# Patient Record
Sex: Female | Born: 1950 | Race: White | Hispanic: No | Marital: Married
Health system: Midwestern US, Academic
[De-identification: ages and names within clinical notes are randomized; demographics above are authoritative.]

---

## 2017-02-07 LAB — COMPREHENSIVE METABOLIC PANEL
Lab: 1.1 — ABNORMAL HIGH (ref 0.0–1.0)
Lab: 13
Lab: 17
Lab: 18
Lab: 3.8
Lab: 67
Lab: 7

## 2017-02-07 LAB — LIPID PROFILE: Lab: 4

## 2017-11-08 ENCOUNTER — Encounter: Admit: 2017-11-08 | Discharge: 2017-11-08 | Payer: MEDICARE

## 2017-11-11 ENCOUNTER — Encounter: Admit: 2017-11-11 | Discharge: 2017-11-11 | Payer: MEDICARE

## 2017-11-11 DIAGNOSIS — G4733 Obstructive sleep apnea (adult) (pediatric): ICD-10-CM

## 2017-11-11 DIAGNOSIS — E119 Type 2 diabetes mellitus without complications: ICD-10-CM

## 2017-11-11 DIAGNOSIS — E785 Hyperlipidemia, unspecified: Principal | ICD-10-CM

## 2017-11-11 DIAGNOSIS — E669 Obesity, unspecified: ICD-10-CM

## 2017-11-12 ENCOUNTER — Ambulatory Visit: Admit: 2017-11-12 | Discharge: 2017-11-13 | Payer: MEDICARE

## 2017-11-12 ENCOUNTER — Encounter: Admit: 2017-11-12 | Discharge: 2017-11-12 | Payer: MEDICARE

## 2017-11-12 DIAGNOSIS — E669 Obesity, unspecified: ICD-10-CM

## 2017-11-12 DIAGNOSIS — G4733 Obstructive sleep apnea (adult) (pediatric): ICD-10-CM

## 2017-11-12 DIAGNOSIS — E119 Type 2 diabetes mellitus without complications: ICD-10-CM

## 2017-11-12 DIAGNOSIS — E039 Hypothyroidism, unspecified: ICD-10-CM

## 2017-11-12 DIAGNOSIS — R0600 Dyspnea, unspecified: ICD-10-CM

## 2017-11-12 DIAGNOSIS — I10 Essential (primary) hypertension: Principal | ICD-10-CM

## 2017-11-12 DIAGNOSIS — E785 Hyperlipidemia, unspecified: Principal | ICD-10-CM

## 2017-11-12 MED ORDER — FUROSEMIDE 20 MG PO TAB
40 mg | ORAL_TABLET | Freq: Every morning | ORAL | 3 refills | 90.00000 days | Status: AC
Start: 2017-11-12 — End: 2018-11-03

## 2017-11-14 ENCOUNTER — Encounter: Admit: 2017-11-14 | Discharge: 2017-11-14 | Payer: MEDICARE

## 2017-11-27 ENCOUNTER — Encounter: Admit: 2017-11-27 | Discharge: 2017-11-27 | Payer: MEDICARE

## 2017-11-27 ENCOUNTER — Ambulatory Visit: Admit: 2017-11-27 | Discharge: 2017-11-28 | Payer: MEDICARE

## 2017-11-27 ENCOUNTER — Ambulatory Visit: Admit: 2017-11-27 | Discharge: 2017-11-27 | Payer: MEDICARE

## 2017-11-27 DIAGNOSIS — I10 Essential (primary) hypertension: Principal | ICD-10-CM

## 2017-11-27 DIAGNOSIS — E039 Hypothyroidism, unspecified: ICD-10-CM

## 2017-11-27 DIAGNOSIS — E785 Hyperlipidemia, unspecified: ICD-10-CM

## 2017-11-27 MED ORDER — PERFLUTREN LIPID MICROSPHERES 1.1 MG/ML IV SUSP
1-20 mL | Freq: Once | INTRAVENOUS | 0 refills | Status: CP | PRN
Start: 2017-11-27 — End: ?

## 2017-11-28 ENCOUNTER — Encounter: Admit: 2017-11-28 | Discharge: 2017-11-28 | Payer: MEDICARE

## 2017-11-28 DIAGNOSIS — E039 Hypothyroidism, unspecified: ICD-10-CM

## 2017-11-28 DIAGNOSIS — E669 Obesity, unspecified: ICD-10-CM

## 2017-11-28 DIAGNOSIS — G4733 Obstructive sleep apnea (adult) (pediatric): ICD-10-CM

## 2017-11-28 DIAGNOSIS — E119 Type 2 diabetes mellitus without complications: ICD-10-CM

## 2017-11-28 DIAGNOSIS — E785 Hyperlipidemia, unspecified: Principal | ICD-10-CM

## 2017-11-29 ENCOUNTER — Encounter: Admit: 2017-11-29 | Discharge: 2017-11-29 | Payer: MEDICARE

## 2017-11-29 DIAGNOSIS — I2510 Atherosclerotic heart disease of native coronary artery without angina pectoris: Principal | ICD-10-CM

## 2017-11-29 DIAGNOSIS — I10 Essential (primary) hypertension: ICD-10-CM

## 2017-11-29 DIAGNOSIS — R9439 Abnormal result of other cardiovascular function study: Principal | ICD-10-CM

## 2017-11-29 DIAGNOSIS — R0600 Dyspnea, unspecified: Principal | ICD-10-CM

## 2017-12-03 ENCOUNTER — Encounter: Admit: 2017-12-03 | Discharge: 2017-12-03 | Payer: MEDICARE

## 2017-12-03 LAB — BASIC METABOLIC PANEL
Lab: 0.8 U/L — ABNORMAL HIGH (ref 7–40)
Lab: 11 mL/min (ref >60)
Lab: 139 mg/dL (ref 8.5–10.6)
Lab: 151 MMOL/L — ABNORMAL HIGH (ref 80–115)
Lab: 21 U/L — ABNORMAL HIGH (ref 9.8–20.1)
Lab: 27 g/dL (ref 3.5–5.0)
Lab: 9.3 U/L (ref 7–56)

## 2017-12-03 LAB — BNP (B-TYPE NATRIURETIC PEPTI): Lab: <10 g/dL (ref 6.0–8.0)

## 2017-12-03 LAB — MAGNESIUM: Lab: 2.4 mg/dL (ref 0.3–1.2)

## 2017-12-03 MED ORDER — ACETAMINOPHEN 325 MG PO TAB
650 mg | ORAL | 0 refills | Status: CN | PRN
Start: 2017-12-03 — End: ?

## 2017-12-03 MED ORDER — ASPIRIN 81 MG PO CHEW
324 mg | Freq: Once | ORAL | 0 refills | Status: CN
Start: 2017-12-03 — End: ?

## 2017-12-03 MED ORDER — ALUMINUM-MAGNESIUM HYDROXIDE 200-200 MG/5 ML PO SUSP
30 mL | ORAL | 0 refills | Status: CN | PRN
Start: 2017-12-03 — End: ?

## 2017-12-03 MED ORDER — NITROGLYCERIN 0.4 MG SL SUBL
.4 mg | SUBLINGUAL | 0 refills | Status: CN | PRN
Start: 2017-12-03 — End: ?

## 2017-12-03 MED ORDER — DOCUSATE SODIUM 100 MG PO CAP
100 mg | Freq: Every day | ORAL | 0 refills | Status: CN | PRN
Start: 2017-12-03 — End: ?

## 2017-12-05 ENCOUNTER — Encounter: Admit: 2017-12-05 | Discharge: 2017-12-05 | Payer: MEDICARE

## 2017-12-05 DIAGNOSIS — I10 Essential (primary) hypertension: ICD-10-CM

## 2017-12-05 DIAGNOSIS — E039 Hypothyroidism, unspecified: ICD-10-CM

## 2017-12-05 DIAGNOSIS — R0600 Dyspnea, unspecified: Principal | ICD-10-CM

## 2017-12-05 DIAGNOSIS — E785 Hyperlipidemia, unspecified: ICD-10-CM

## 2017-12-05 LAB — CBC
Lab: 13
Lab: 275
Lab: 29
Lab: 32 — ABNORMAL LOW (ref 33.0–37.0)
Lab: 11 mL/min — ABNORMAL HIGH (ref 4.8–10.8)
Lab: 14
Lab: 4.9 mL/min — ABNORMAL LOW
Lab: 45
Lab: 90

## 2017-12-06 ENCOUNTER — Encounter: Admit: 2017-12-06 | Discharge: 2017-12-06 | Payer: MEDICARE

## 2017-12-11 ENCOUNTER — Encounter: Admit: 2017-12-11 | Discharge: 2017-12-11 | Payer: MEDICARE

## 2017-12-11 ENCOUNTER — Ambulatory Visit: Admit: 2017-12-11 | Discharge: 2017-12-11 | Payer: MEDICARE

## 2017-12-11 DIAGNOSIS — E119 Type 2 diabetes mellitus without complications: ICD-10-CM

## 2017-12-11 DIAGNOSIS — E785 Hyperlipidemia, unspecified: Principal | ICD-10-CM

## 2017-12-11 DIAGNOSIS — R9439 Abnormal result of other cardiovascular function study: ICD-10-CM

## 2017-12-11 DIAGNOSIS — E039 Hypothyroidism, unspecified: ICD-10-CM

## 2017-12-11 DIAGNOSIS — R0609 Other forms of dyspnea: ICD-10-CM

## 2017-12-11 DIAGNOSIS — E669 Obesity, unspecified: ICD-10-CM

## 2017-12-11 DIAGNOSIS — G4733 Obstructive sleep apnea (adult) (pediatric): ICD-10-CM

## 2017-12-11 LAB — BASIC METABOLIC PANEL
Lab: 0.7 mg/dL (ref 0.4–1.00)
Lab: 10 g/dL — ABNORMAL LOW (ref 3–12)
Lab: 103 MMOL/L (ref 98–110)
Lab: 139 MMOL/L — ABNORMAL HIGH (ref 137–147)
Lab: 162 mg/dL — ABNORMAL HIGH (ref 70–100)
Lab: 18 mg/dL — ABNORMAL LOW (ref 7–25)
Lab: 26 MMOL/L (ref 21–30)
Lab: 4.2 MMOL/L — ABNORMAL HIGH (ref 3.5–5.1)
Lab: 9.2 mg/dL — ABNORMAL HIGH (ref 8.5–10.6)
Lab: >60 mL/min — ABNORMAL HIGH (ref >60)
Lab: >60 mL/min — ABNORMAL LOW (ref >60)

## 2017-12-11 LAB — POC GLUCOSE: Lab: 156 mg/dL — ABNORMAL HIGH (ref 70–100)

## 2017-12-11 LAB — LIPID PROFILE: Lab: 127 mg/dL (ref <200)

## 2017-12-11 MED ORDER — ASPIRIN 81 MG PO CHEW
324 mg | Freq: Once | ORAL | 0 refills | Status: DC
Start: 2017-12-11 — End: 2017-12-12

## 2017-12-11 MED ORDER — DOCUSATE SODIUM 100 MG PO CAP
100 mg | Freq: Every day | ORAL | 0 refills | Status: DC | PRN
Start: 2017-12-11 — End: 2017-12-12

## 2017-12-11 MED ORDER — SODIUM CHLORIDE 0.9 % IV SOLP
1000 mL | INTRAVENOUS | 0 refills | Status: DC
Start: 2017-12-11 — End: 2017-12-12
  Administered 2017-12-11: 16:00:00 1000 mL via INTRAVENOUS

## 2017-12-11 MED ORDER — ALUMINUM-MAGNESIUM HYDROXIDE 200-200 MG/5 ML PO SUSP
30 mL | ORAL | 0 refills | Status: DC | PRN
Start: 2017-12-11 — End: 2017-12-12

## 2017-12-11 MED ORDER — ACETAMINOPHEN 325 MG PO TAB
650 mg | ORAL | 0 refills | Status: DC | PRN
Start: 2017-12-11 — End: 2017-12-12

## 2017-12-11 MED ORDER — SODIUM CHLORIDE 0.9 % IV SOLP
250 mL | INTRAVENOUS | 0 refills | Status: CP
Start: 2017-12-11 — End: ?
  Administered 2017-12-11: 08:00:00 250 mL via INTRAVENOUS

## 2017-12-11 MED ORDER — NITROGLYCERIN 0.4 MG SL SUBL
.4 mg | SUBLINGUAL | 0 refills | Status: DC | PRN
Start: 2017-12-11 — End: 2017-12-12

## 2017-12-11 MED ORDER — METFORMIN 500 MG PO TAB
500 mg | ORAL_TABLET | Freq: Two times a day (BID) | ORAL | 3 refills | Status: AC
Start: 2017-12-11 — End: 2019-05-04

## 2017-12-13 ENCOUNTER — Encounter: Admit: 2017-12-13 | Discharge: 2017-12-13 | Payer: MEDICARE

## 2017-12-16 LAB — POC ACTIVATED CLOTTING TIME: Lab: 277 s

## 2018-01-07 ENCOUNTER — Encounter: Admit: 2018-01-07 | Discharge: 2018-01-07 | Payer: MEDICARE

## 2018-02-27 ENCOUNTER — Encounter: Admit: 2018-02-27 | Discharge: 2018-02-27 | Payer: MEDICARE

## 2018-02-27 ENCOUNTER — Ambulatory Visit: Admit: 2018-02-27 | Discharge: 2018-02-27 | Payer: MEDICARE

## 2018-02-27 DIAGNOSIS — I10 Essential (primary) hypertension: Principal | ICD-10-CM

## 2018-02-27 DIAGNOSIS — E785 Hyperlipidemia, unspecified: Principal | ICD-10-CM

## 2018-02-27 DIAGNOSIS — I2510 Atherosclerotic heart disease of native coronary artery without angina pectoris: ICD-10-CM

## 2018-02-27 DIAGNOSIS — R9439 Abnormal result of other cardiovascular function study: ICD-10-CM

## 2018-02-27 DIAGNOSIS — E039 Hypothyroidism, unspecified: ICD-10-CM

## 2018-02-27 DIAGNOSIS — E669 Obesity, unspecified: ICD-10-CM

## 2018-02-27 DIAGNOSIS — I6521 Occlusion and stenosis of right carotid artery: ICD-10-CM

## 2018-02-27 DIAGNOSIS — R0989 Other specified symptoms and signs involving the circulatory and respiratory systems: ICD-10-CM

## 2018-02-27 DIAGNOSIS — G4733 Obstructive sleep apnea (adult) (pediatric): ICD-10-CM

## 2018-02-27 DIAGNOSIS — E119 Type 2 diabetes mellitus without complications: ICD-10-CM

## 2018-02-27 MED ORDER — CARVEDILOL 6.25 MG PO TAB
6.25 mg | ORAL_TABLET | Freq: Two times a day (BID) | ORAL | 3 refills | 90.00000 days | Status: AC
Start: 2018-02-27 — End: 2019-03-13

## 2018-02-27 MED ORDER — ATORVASTATIN 80 MG PO TAB
80 mg | ORAL_TABLET | Freq: Every day | ORAL | 3 refills | Status: AC
Start: 2018-02-27 — End: 2019-05-08

## 2018-02-27 MED ORDER — NITROGLYCERIN 0.4 MG SL SUBL
ORAL_TABLET | SUBLINGUAL | 3 refills | 9.00000 days | Status: AC | PRN
Start: 2018-02-27 — End: ?

## 2018-03-04 ENCOUNTER — Encounter: Admit: 2018-03-04 | Discharge: 2018-03-04 | Payer: MEDICARE

## 2018-03-04 DIAGNOSIS — I6521 Occlusion and stenosis of right carotid artery: Principal | ICD-10-CM

## 2018-06-09 ENCOUNTER — Encounter: Admit: 2018-06-09 | Discharge: 2018-06-09 | Payer: MEDICARE

## 2018-06-09 NOTE — Telephone Encounter
Erroneous encounter-disregard

## 2018-09-11 ENCOUNTER — Encounter: Admit: 2018-09-11 | Discharge: 2018-09-11

## 2018-09-11 ENCOUNTER — Ambulatory Visit: Admit: 2018-09-11 | Discharge: 2018-09-12

## 2018-09-11 DIAGNOSIS — R0989 Other specified symptoms and signs involving the circulatory and respiratory systems: Secondary | ICD-10-CM

## 2018-09-11 DIAGNOSIS — E785 Hyperlipidemia, unspecified: Secondary | ICD-10-CM

## 2018-09-11 DIAGNOSIS — E119 Type 2 diabetes mellitus without complications: Secondary | ICD-10-CM

## 2018-09-11 DIAGNOSIS — I2510 Atherosclerotic heart disease of native coronary artery without angina pectoris: Secondary | ICD-10-CM

## 2018-09-11 DIAGNOSIS — G4733 Obstructive sleep apnea (adult) (pediatric): Secondary | ICD-10-CM

## 2018-09-11 DIAGNOSIS — R0600 Dyspnea, unspecified: Secondary | ICD-10-CM

## 2018-09-11 DIAGNOSIS — I10 Essential (primary) hypertension: Secondary | ICD-10-CM

## 2018-09-11 DIAGNOSIS — R9439 Abnormal result of other cardiovascular function study: Secondary | ICD-10-CM

## 2018-09-11 DIAGNOSIS — E669 Obesity, unspecified: Secondary | ICD-10-CM

## 2018-09-11 DIAGNOSIS — R739 Hyperglycemia, unspecified: Secondary | ICD-10-CM

## 2018-09-11 DIAGNOSIS — E039 Hypothyroidism, unspecified: Secondary | ICD-10-CM

## 2018-09-11 DIAGNOSIS — I6521 Occlusion and stenosis of right carotid artery: Secondary | ICD-10-CM

## 2018-09-11 NOTE — Progress Notes
Date of Service: 09/11/2018    Meghan Carlson is a 68 y.o. female.       HPI     Meghan Carlson is a 68 y.o. female who returns to see me in the advanced heart failure clinic in [place].  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 Her echocardiogram performed 11/27/2017 showed an EF of 45 to 50% with mild LVH, mild diastolic dysfunction, no significant valvular disease, and mild right-sided dysfunction.  Her central venous pressure was normal. Coronary angiography 12/11/2017 revealed no obstructive lesions in the major vessels, though she did have a 90% lesion in the very small diagonal.  She had normal hemodynamics.  It was noted that radial approach was very difficult and uncomfortable, so any future attempt at cardiac catheterization should probably be with femoral access.  Carotid doppler ordered 02/2018 showed no hemodynamically significant stenosis and minimal plaque.    Today she reports stable dyspnea on exertion, sometimes worse in allergy season, but improved on inhaler.  She currently denies exertional chest pain, irregular heartbeat/palpitations, dizzy spells, syncope/near syncope, edema, orthopnea, and paroxysmal nocturnal dyspnea.  Home blood pressure is not checked.  Weight on home scale has been stable within 5 pounds, and varies with diet.  She does not feel she is retaining fluid.         Vitals:    09/11/18 1406 09/11/18 1412   BP: 124/78 122/74   BP Source: Arm, Left Upper Arm, Right Upper   Pulse: 63    SpO2: 94%    Weight: 87.1 kg (192 lb)    Height: 1.6 m (5' 3)    PainSc: Zero      Body mass index is 34.01 kg/m???.     Past Medical History  Patient Active Problem List    Diagnosis Date Noted   ??? Coronary artery disease involving native coronary artery of native heart without angina pectoris 08/25/2018   ??? Carotid artery calcification 03/18/2018 03/05/18 Carotid duplex:  Minimal calcified plaque in the right carotid bulb.  No hemodynamically significant common or internal carotid artery stenosis.       ??? Abnormal stress test 12/11/2017   ??? DOE (dyspnea on exertion) 11/28/2017   ??? Hypothyroidism 11/11/2017   ??? Dyslipidemia 11/11/2017   ??? Essential hypertension 11/11/2017   ??? Obesity 11/11/2017   ??? Diabetes mellitus (HCC) 11/11/2017   ??? OSA (obstructive sleep apnea) 11/11/2017         Review of Systems   Constitution: Negative.   HENT: Negative.    Eyes: Negative.    Cardiovascular: Negative.    Respiratory: Negative.    Endocrine: Negative.    Hematologic/Lymphatic: Negative.    Skin: Negative.    Musculoskeletal: Negative.    Gastrointestinal: Negative.    Genitourinary: Negative.    Neurological: Negative.    Psychiatric/Behavioral: Negative.    Allergic/Immunologic: Negative.        Physical Exam  Constitutional: She appears well-developed and well-nourished.   HENT:  Head: Normocephalic.   Mouth/Throat: Oropharynx is clear and moist.   Eyes: Conjunctivae are normal.   Neck: Normal range of motion. No JVD present. Normal carotid pulses.  Cardiovascular: Normal rate, regular rhythm, normal heart sounds and intact distal pulses. Exam reveals no gallop and no friction rub.  No murmur heard.  Pulmonary/Chest: Effort normal and breath sounds normal. No respiratory distress. She has no wheezes. She has  no rales. She exhibits no chest wall tenderness.   Abdominal: Soft. Bowel sounds are normal. She exhibits no distension. There is no tenderness.   Musculoskeletal: Normal range of motion and muscular tone. She exhibits no edema with no tenderness.   Neurological: She is alert and oriented to person, place, and time. No focal deficits.  Skin: Skin is warm. No erythema.   Psychiatric: She has a normal mood and affect. Judgment, behavior, and thought content normal.     CBC w/Diff    Lab Results   Component Value Date/Time    WBC 11.5 (H) 12/05/2017 RBC 4.98 12/05/2017    HGB 14.9 12/05/2017    HCT 45.3 12/05/2017    MCV 90.9 12/05/2017    MCH 29.9 12/05/2017    MCHC 32.9 (L) 12/05/2017    RDW 13.3 12/05/2017    PLTCT 275 12/05/2017    No results found for: NEUT, ANC, LYMA, ALC, MONA, AMC, EOSA, AEC, BASA, ABC     Comprehensive Metabolic Profile    Lab Results   Component Value Date/Time    NA 139 12/11/2017 10:00 AM    K 4.2 12/11/2017 10:00 AM    K 4.4 12/03/2017    K 3.8 02/07/2017    CL 103 12/11/2017 10:00 AM    CO2 26 12/11/2017 10:00 AM    GAP 10 12/11/2017 10:00 AM    BUN 18 12/11/2017 10:00 AM    CR 0.73 12/11/2017 10:00 AM    CR 0.81 12/03/2017    CR 0.70 02/07/2017    GLU 162 (H) 12/11/2017 10:00 AM    Lab Results   Component Value Date/Time    CA 9.2 12/11/2017 10:00 AM    ALBUMIN 3.8 02/07/2017    TOTPROT 7.0 02/07/2017    ALKPHOS 67 02/07/2017    AST 18 02/07/2017    ALT 17 02/07/2017    TOTBILI 1.10 (H) 02/07/2017    GFR >60 12/11/2017 10:00 AM    GFRAA >60 12/11/2017 10:00 AM        Cardiac Enzymes Lipids   Lab Results   Component Value Date/Time    BNP <10 12/03/2017    Lab Results   Component Value Date    CHOL 127 12/11/2017    TRIG 169 (H) 12/11/2017    HDL 32 (L) 12/11/2017    LDL 74 12/11/2017    VLDL 34 12/11/2017    NONHDLCHOL 95 12/11/2017    CHOLHDLC 4 02/07/2017         Coagulation Endocrine   No results found for: INR, PT Lab Results   Component Value Date/Time    HGBA1C 6.7 (H) 02/07/2017    TSH 3.25 02/07/2017             Cardiovascular Studies      Problems Addressed Today  Encounter Diagnoses   Name Primary?   ??? Dyslipidemia Yes   ??? Essential hypertension    ??? Bruit    ??? Calcification of right carotid artery    ??? Elevated blood sugar    ??? DOE (dyspnea on exertion)    ??? Coronary artery disease involving native coronary artery of native heart without angina pectoris        Assessment and Plan     Today she is warm, normotensive, euvolemic, and was stable NYHA functional class II symptoms.  I think her worsening shortness of breath might be related to reactive airway disease, and we will send her for pulmonary function testing to see if  she needs more aggressive management of inhalers.  This can be followed with pulmonary referral if appropriate after the results are known.  She is appropriately on statin for dyslipidemia.  Her blood pressure is well controlled.    Patient instructions provided in writing:  1. Med changes today:  None  2. Please get pulmonary function testing for dyspnea and wheezing.  3. Please log your weight, blood pressure, and heart rate and bring to all visits.  4. Labs today: BNP, BMP, Mg, A1C and lipid panel  5. Return in 6 months.    Thank you for allowing me to participate in the care of this patient.  Please do not hesitate to contact me should you have any questions or concerns.    Baldo Ash, MD, PhD  Advanced Heart Failure and Transplant Cardiology  Pager [phone]       I spent 25 minutes with the patient today including approximately 15 minutes in counseling on her heart disease.  We reviewed the above treatment plan, went over risks/benefits/alternatives to the therapies, and all questions were answered to her satisfaction.      Current Medications (including today's revisions)  ??? albuterol (VENTOLIN HFA) 90 mcg/actuation inhaler Inhale 2 puffs by mouth into the lungs every 6 hours as needed for Wheezing or Shortness of Breath. Shake well before use.   ??? albuterol 0.083% (PROVENTIL; VENTOLIN) 2.5 mg /3 mL (0.083 %) nebulizer solution Inhale 3 mL solution by nebulizer as directed every 4 hours as needed for Wheezing or Shortness of Breath.   ??? atorvastatin (LIPITOR) 80 mg tablet Take one tablet by mouth daily.   ??? carvedilol (COREG) 6.25 mg tablet Take one tablet by mouth twice daily with meals. Take with food.   ??? diclofenac sodium (VOLTAREN-XR) 100 mg xr tablet Take 100 mg by mouth daily. Take with food. ??? escitalopram oxalate (LEXAPRO) 20 mg tablet Take 20 mg by mouth daily.   ??? fluticasone propionate (FLONASE) 50 mcg/actuation nasal spray Apply  to each nostril as directed as Needed. Shake bottle gently before using.    ??? furosemide (LASIX) 20 mg tablet Take two tablets by mouth every morning. As needed for swelling or weight gain   ??? levothyroxine (SYNTHROID) 50 mcg tablet Take 50 mcg by mouth daily 30 minutes before breakfast.   ??? losartan (COZAAR) 25 mg tablet Take 25 mg by mouth daily.   ??? metFORMIN (GLUCOPHAGE) 500 mg tablet Take one tablet by mouth twice daily with meals. Ok to resume on December 14, 2017   ??? nitroglycerin (NITROSTAT) 0.4 mg tablet 1 tab under tongue as needed for chest pain, may repeat in 5 min x 2   ??? oxycodone(+) (ROXICODONE, OXY-IR) 20 mg tablet Take 20 mg by mouth every 6 hours as needed   ??? triamcinolone acetonide (KENALOG) 0.1 % topical cream Apply  topically to affected area twice daily.   ??? valACYclovir (VALTREX) 500 mg tablet Take 500 mg by mouth every 24 hours.

## 2018-09-12 ENCOUNTER — Encounter: Admit: 2018-09-12 | Discharge: 2018-09-12

## 2018-09-12 DIAGNOSIS — I6521 Occlusion and stenosis of right carotid artery: Secondary | ICD-10-CM

## 2018-09-12 DIAGNOSIS — E785 Hyperlipidemia, unspecified: Secondary | ICD-10-CM

## 2018-09-12 DIAGNOSIS — I10 Essential (primary) hypertension: Secondary | ICD-10-CM

## 2018-09-12 DIAGNOSIS — R739 Hyperglycemia, unspecified: Secondary | ICD-10-CM

## 2018-09-12 DIAGNOSIS — R0989 Other specified symptoms and signs involving the circulatory and respiratory systems: Secondary | ICD-10-CM

## 2018-09-12 LAB — LIPID PROFILE
Lab: 113
Lab: 170 — ABNORMAL HIGH (ref <150)
Lab: 25 — ABNORMAL LOW (ref >40)
Lab: 34
Lab: 5
Lab: 54

## 2018-09-12 LAB — BASIC METABOLIC PANEL: Lab: 140

## 2018-09-12 LAB — HEMOGLOBIN A1C: Lab: 7.2 — ABNORMAL HIGH (ref <5.7)

## 2018-09-12 LAB — BNP (B-TYPE NATRIURETIC PEPTI): Lab: 23

## 2018-09-12 LAB — MAGNESIUM: Lab: 1.9 K/UL — AB (ref 0–0.20)

## 2018-09-15 ENCOUNTER — Encounter: Admit: 2018-09-15 | Discharge: 2018-09-15

## 2018-09-15 MED ORDER — FARXIGA 5 MG PO TAB
5 mg | ORAL_TABLET | Freq: Every day | ORAL | 3 refills | Status: DC
Start: 2018-09-15 — End: 2019-03-19

## 2018-09-15 NOTE — Telephone Encounter
[place], [employer], MD  Angelyn Punt, RN            Based on the lab results I would like to be a little bit more aggressive with the diabetes management. Let us start dapagliflozin at 5 mg daily in the morning, with plan to increase to 10 mg if tolerated and for better glucose control. She is welcome to review this with me or with her primary care physician or diabetes doctors if she has any questions. Thank you!      Called and spoke with patient on the phone. Discussed new medication. Pt is open to trying medication, does not feel like she needs to review with her PCP/diabetes doctor at this time. we discussed her previous labs that she had drawn.   She will pick [facility] up at local [facility] and call our office with any questions.

## 2018-10-06 ENCOUNTER — Encounter: Admit: 2018-10-06 | Discharge: 2018-10-06

## 2018-10-06 DIAGNOSIS — E119 Type 2 diabetes mellitus without complications: Secondary | ICD-10-CM

## 2018-10-06 DIAGNOSIS — E039 Hypothyroidism, unspecified: Secondary | ICD-10-CM

## 2018-10-06 DIAGNOSIS — E785 Hyperlipidemia, unspecified: Secondary | ICD-10-CM

## 2018-10-06 DIAGNOSIS — G4733 Obstructive sleep apnea (adult) (pediatric): Secondary | ICD-10-CM

## 2018-10-06 DIAGNOSIS — E669 Obesity, unspecified: Secondary | ICD-10-CM

## 2018-10-06 DIAGNOSIS — R9439 Abnormal result of other cardiovascular function study: Secondary | ICD-10-CM

## 2018-11-01 ENCOUNTER — Encounter: Admit: 2018-11-01 | Discharge: 2018-11-01

## 2018-11-03 MED ORDER — FUROSEMIDE 20 MG PO TAB
ORAL_TABLET | ORAL | 3 refills | 90.00000 days | Status: AC | PRN
Start: 2018-11-03 — End: ?

## 2018-12-11 ENCOUNTER — Encounter: Admit: 2018-12-11 | Discharge: 2018-12-11

## 2018-12-11 DIAGNOSIS — R9439 Abnormal result of other cardiovascular function study: Secondary | ICD-10-CM

## 2018-12-11 MED ORDER — METFORMIN 500 MG PO TAB
ORAL_TABLET | Freq: Two times a day (BID) | 3 refills
Start: 2018-12-11 — End: ?

## 2018-12-11 NOTE — Telephone Encounter
Refill request received for Metformin. Call placed to pt to inform her this refill will need to come from her PCP, Dr. Nyoka Cowden. And we will contact her pharmacy to request they forward refill request to her. LM on unauthorized VM and sent [place] msg. Requested reply if any questions.    [facility] and spoke to [place] requesting they forward refill request to Wenda Low, M.D. She will forward refill request to PCP.

## 2018-12-25 ENCOUNTER — Encounter: Admit: 2018-12-25 | Discharge: 2018-12-25 | Payer: MEDICARE

## 2019-03-13 ENCOUNTER — Encounter: Admit: 2019-03-13 | Discharge: 2019-03-13 | Payer: MEDICARE

## 2019-03-13 MED ORDER — CARVEDILOL 6.25 MG PO TAB
6.25 mg | ORAL_TABLET | Freq: Two times a day (BID) | ORAL | 3 refills | 90.00000 days | Status: AC
Start: 2019-03-13 — End: ?

## 2019-03-19 ENCOUNTER — Encounter: Admit: 2019-03-19 | Discharge: 2019-03-19 | Payer: MEDICARE

## 2019-03-19 DIAGNOSIS — E119 Type 2 diabetes mellitus without complications: Secondary | ICD-10-CM

## 2019-03-19 DIAGNOSIS — R9439 Abnormal result of other cardiovascular function study: Secondary | ICD-10-CM

## 2019-03-19 DIAGNOSIS — G4733 Obstructive sleep apnea (adult) (pediatric): Secondary | ICD-10-CM

## 2019-03-19 DIAGNOSIS — I10 Essential (primary) hypertension: Secondary | ICD-10-CM

## 2019-03-19 DIAGNOSIS — I2510 Atherosclerotic heart disease of native coronary artery without angina pectoris: Secondary | ICD-10-CM

## 2019-03-19 DIAGNOSIS — E785 Hyperlipidemia, unspecified: Secondary | ICD-10-CM

## 2019-03-19 DIAGNOSIS — R0600 Dyspnea, unspecified: Secondary | ICD-10-CM

## 2019-03-19 DIAGNOSIS — E669 Obesity, unspecified: Secondary | ICD-10-CM

## 2019-03-19 DIAGNOSIS — E039 Hypothyroidism, unspecified: Secondary | ICD-10-CM

## 2019-03-19 MED ORDER — SPIRONOLACTONE 25 MG PO TAB
25 mg | ORAL_TABLET | Freq: Every day | ORAL | 3 refills | 90.00000 days | Status: DC
Start: 2019-03-19 — End: 2019-06-04

## 2019-03-19 MED ORDER — LOSARTAN 50 MG PO TAB
50 mg | ORAL_TABLET | Freq: Every day | ORAL | 3 refills | 30.00000 days | Status: AC
Start: 2019-03-19 — End: ?

## 2019-03-19 NOTE — Progress Notes
Date of Service: 03/19/2019    Meghan Carlson is a 68 y.o. female.       HPI     Meghan Carlson?is a 68 y.o.?female?who returns to see me in the advanced heart failure clinic in [place]. ?She has biventricular nonischemic cardiomyopathy and stable?coronary disease?discovered in 2019 at time of and left heart cath done for abnormal stress test. Her echocardiogram performed 11/27/2017 showed an EF of 45 to 50% with mild LVH, mild diastolic dysfunction, no significant valvular disease, and mild right-sided dysfunction.??Her central venous pressure was normal. Coronary angiography 12/11/2017 revealed no obstructive lesions in the major vessels, though she did have a 90% lesion in the very small diagonal. ?She had normal hemodynamics. ?It was noted that radial approach was very difficult and uncomfortable, so any future attempt at cardiac catheterization should probably be with femoral access.  Carotid doppler ordered 02/2018 showed no hemodynamically significant stenosis and minimal plaque.  She has had right rotator cuff surgery just before halloween, and has been recovering.    Today she reports stable dyspnea on exertion if she walks fast or goes up a flight of stairs, and she has also had worsening fatigue.  She currently denies exertional chest pain, irregular heartbeat/palpitations, dizzy spells, syncope/near syncope, edema, and paroxysmal nocturnal dyspnea.  Her home blood pressure is typically 140s/60-70s, and pulse remains around 60-80.  Her weight monitored on home scale has been down a few pounds.    She has had orthopnea and sleeps 20 degrees elevated, though allergies and congestion are worse flat as well.  She notes today worsening of her eczema in the winter and occasional loose stools on Metformin. Her EKG today sinus rhythm at 58 bpm with a QRS of 100 ms, QTC 421 ms.  There is no evidence of ischemia, although she has flat T waves diffusely.  There is no evidence of prior infarct or chamber enlargement.       Vitals:    03/19/19 1055 03/19/19 1111   BP: (!) 146/82 (!) 148/82   BP Source: Arm, Left Upper Arm, Left Upper   Pulse: 64    Temp: 36.5 ?C (97.7 ?F)    SpO2: 98%    Weight: 83.3 kg (183 lb 9.6 oz)    Height: 1.6 m (5' 3)    PainSc: Zero      Body mass index is 32.52 kg/m?Marland Kitchen     Past Medical History  Patient Active Problem List    Diagnosis Date Noted   ? Coronary artery disease involving native coronary artery of native heart without angina pectoris 08/25/2018   ? Carotid artery calcification 03/18/2018     03/05/18 Carotid duplex:  Minimal calcified plaque in the right carotid bulb.  No hemodynamically significant common or internal carotid artery stenosis.       ? Abnormal stress test 12/11/2017   ? DOE (dyspnea on exertion) 11/28/2017   ? Hypothyroidism 11/11/2017   ? Dyslipidemia 11/11/2017   ? Essential hypertension 11/11/2017   ? Obesity 11/11/2017   ? Diabetes mellitus (HCC) 11/11/2017   ? OSA (obstructive sleep apnea) 11/11/2017         Review of Systems   Constitution: Negative.   HENT: Negative.    Eyes: Negative.    Cardiovascular: Negative.    Respiratory: Negative.    Endocrine: Negative.    Hematologic/Lymphatic: Negative.    Skin: Negative.    Musculoskeletal: Negative.    Gastrointestinal: Negative.    Genitourinary: Negative.    Neurological:  Negative.    Psychiatric/Behavioral: Negative.    Allergic/Immunologic: Negative.        Physical Exam  Constitutional: She appears well-developed and well-nourished.   HENT:  Head: Normocephalic.   Mouth/Throat: Oropharynx is clear and moist.   Eyes: Conjunctivae are normal.   Neck: Normal range of motion. No JVD present. Normal carotid pulses. Cardiovascular: Normal rate, regular rhythm, normal heart sounds and intact distal pulses. Exam reveals no gallop and no friction rub.  No murmur heard.  Pulmonary/Chest: Effort normal and breath sounds normal. No respiratory distress. She has no wheezes. She has no rales. She exhibits no chest wall tenderness.   Abdominal: Soft. Bowel sounds are normal. She exhibits no distension. There is no tenderness.   Musculoskeletal: Normal range of motion and muscular tone. She exhibits no edema with no tenderness.   Neurological: She is alert and oriented to person, place, and time. No focal deficits.  Skin: Skin is warm. No erythema.   Psychiatric: She has a normal mood and affect. Judgment, behavior, and thought content normal.       Cardiovascular Studies      Problems Addressed Today  Encounter Diagnoses   Name Primary?   ? Essential hypertension Yes   ? Coronary artery disease involving native coronary artery of native heart without angina pectoris    ? DOE (dyspnea on exertion)    ? Abnormal stress test    ? OSA (obstructive sleep apnea)        Assessment and Plan     Today she appears hypertensive, euvolemic, and with stable NYHA functional class III symptoms of systolic heart failure with underlying coronary artery disease, but no angina.  Plan:  1. Med changes today:  Increase losartan to 50 mg daily.  2. Start Aldactone 25 mg daily as well.  3. Try increasing Metformin to 1000 mg twice daily, if GI symptoms or loose stools worsen revert back to 500 mg dose.  4. We will stop [place] after this current supply runs out due to financial constraints.  5. We will provide sheets for her to record weight, blood pressure, and heart rate daily and bring this log to all clinic visits.  6. 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  7. We will also schedule PFT for worsening dyspnea as these were not obtained after last visit.  8. After the above medication changes we will repeat labs in 2 weeks: BNP, BMP, Mg 9. She will return to see Dr. Pierre Bali in 3 months.         I spent 30 minutes with the patient today including greater than 15 minutes in counseling on her heart disease.  We reviewed the above treatment plan, went over risks/benefits/alternatives to the therapies, and all questions were answered to her satisfaction.      Current Medications (including today's revisions)  ? albuterol (VENTOLIN HFA) 90 mcg/actuation inhaler Inhale 2 puffs by mouth into the lungs every 6 hours as needed for Wheezing or Shortness of Breath. Shake well before use.   ? albuterol 0.083% (PROVENTIL; VENTOLIN) 2.5 mg /3 mL (0.083 %) nebulizer solution Inhale 3 mL solution by nebulizer as directed every 4 hours as needed for Wheezing or Shortness of Breath.   ? atorvastatin (LIPITOR) 80 mg tablet Take one tablet by mouth daily.   ? carvediloL (COREG) 6.25 mg tablet TAKE ONE TABLET BY MOUTH TWICE DAILY WITH MEALS. TAKE WITH FOOD.   ? dapagliflozin (FARXIGA) 5 mg tablet Take one tablet  by mouth daily.   ? diclofenac sodium (VOLTAREN-XR) 100 mg xr tablet Take 100 mg by mouth daily. Take with food.   ? escitalopram oxalate (LEXAPRO) 20 mg tablet Take 20 mg by mouth daily.   ? fluticasone propionate (FLONASE) 50 mcg/actuation nasal spray Apply  to each nostril as directed as Needed. Shake bottle gently before using.    ? furosemide (LASIX) 20 mg tablet TAKE 2 TABLETS BY MOUTH IN THE MORNING AS NEEDED FOR SWELLING OR WEIGHT GAIN   ? levothyroxine (SYNTHROID) 50 mcg tablet Take 50 mcg by mouth daily 30 minutes before breakfast.   ? losartan (COZAAR) 25 mg tablet Take 25 mg by mouth daily.   ? metFORMIN (GLUCOPHAGE) 500 mg tablet Take one tablet by mouth twice daily with meals. Ok to resume on December 14, 2017   ? nitroglycerin (NITROSTAT) 0.4 mg tablet 1 tab under tongue as needed for chest pain, may repeat in 5 min x 2   ? oxycodone(+) (ROXICODONE, OXY-IR) 20 mg tablet Take 20 mg by mouth every 6 hours as needed ? triamcinolone acetonide (KENALOG) 0.1 % topical cream Apply  topically to affected area twice daily.   ? valACYclovir (VALTREX) 500 mg tablet Take 500 mg by mouth every 24 hours.

## 2019-03-25 ENCOUNTER — Encounter: Admit: 2019-03-25 | Discharge: 2019-03-25 | Payer: MEDICARE

## 2019-03-25 ENCOUNTER — Ambulatory Visit: Admit: 2019-03-25 | Discharge: 2019-03-25 | Payer: MEDICARE

## 2019-03-25 DIAGNOSIS — R9439 Abnormal result of other cardiovascular function study: Secondary | ICD-10-CM

## 2019-03-25 DIAGNOSIS — R0600 Dyspnea, unspecified: Secondary | ICD-10-CM

## 2019-03-25 DIAGNOSIS — I10 Essential (primary) hypertension: Secondary | ICD-10-CM

## 2019-03-25 DIAGNOSIS — G4733 Obstructive sleep apnea (adult) (pediatric): Secondary | ICD-10-CM

## 2019-03-25 DIAGNOSIS — I2510 Atherosclerotic heart disease of native coronary artery without angina pectoris: Secondary | ICD-10-CM

## 2019-03-25 LAB — BASIC METABOLIC PANEL
Lab: 0.6
Lab: 105
Lab: 14
Lab: 14
Lab: 142
Lab: 151 — ABNORMAL HIGH (ref 70–105)
Lab: 26
Lab: 3.4 — ABNORMAL LOW (ref 3.5–5.1)
Lab: 8.9
Lab: 93

## 2019-03-25 LAB — BNP (B-TYPE NATRIURETIC PEPTI)

## 2019-04-01 ENCOUNTER — Encounter: Admit: 2019-04-01 | Discharge: 2019-04-01 | Payer: MEDICARE

## 2019-04-01 DIAGNOSIS — I2510 Atherosclerotic heart disease of native coronary artery without angina pectoris: Secondary | ICD-10-CM

## 2019-04-01 DIAGNOSIS — R0600 Dyspnea, unspecified: Secondary | ICD-10-CM

## 2019-04-01 DIAGNOSIS — E876 Hypokalemia: Secondary | ICD-10-CM

## 2019-04-01 NOTE — Telephone Encounter
04/01/2019 9:53 AM   Notified patient of plan to have labs drawn 2 weeks after medication change and addition and then assess if aldactone needs to be increased at that time. Patient to have labs drawn around 04/08/2019. Labs ordered and faxed to [facility] per patient request.    [place], [employer], MD  P Cvm Nurse [place]  More time: Needs labs 2 weeks after the med changes, I think losartan was changed too. If potassium still low after those changes we can revisit 50mg  Aldactone at that time..     From: Katina Degree, RN   Sent: 03/30/2019  1:43 PM CST   To: Jamison Neighbor, MD   Hello Dr. Adah Perl,   I called this patient last Thursday and she had not started the Aldactone. She began taking it on Friday 03/27/2019. She called today to report her bp: 132/75, 138/80 and 128/71. Would you like to give the aldactone 25 mg a little more time to work, increase her aldactone to 50 mg and/or add potassium? Just wanted to clarify your note.   Thanks,   Brette Cast     From: [place], [employer], MD   Sent: 03/25/2019  2:22 PM CST   To: Cvm Nurse [place]     Let us to this: Please call patient for blood pressure update and to verify she did the medication changes we discussed last visit. If blood pressure still greater than 120/80 I would increase the Aldactone to 50 mg. If blood pressure is below that, then we should keep Aldactone as is and start 40 mEq of potassium daily. If she prefers, 20 mEq twice daily if that is easier to consume.

## 2019-04-16 ENCOUNTER — Encounter: Admit: 2019-04-16 | Discharge: 2019-04-16 | Payer: MEDICARE

## 2019-04-16 DIAGNOSIS — E876 Hypokalemia: Secondary | ICD-10-CM

## 2019-04-16 DIAGNOSIS — R0600 Dyspnea, unspecified: Secondary | ICD-10-CM

## 2019-04-16 DIAGNOSIS — I2510 Atherosclerotic heart disease of native coronary artery without angina pectoris: Secondary | ICD-10-CM

## 2019-04-16 LAB — BASIC METABOLIC PANEL
Lab: 0.8
Lab: 101
Lab: 19
Lab: 24
Lab: 243 — ABNORMAL HIGH (ref 70–105)
Lab: 4.4
Lab: 9.5
Lab: 138

## 2019-04-16 LAB — BNP (B-TYPE NATRIURETIC PEPTI): Lab: <10 K/UL (ref 0–0.20)

## 2019-04-16 LAB — MAGNESIUM: Lab: 1.7 K/UL (ref >60)

## 2019-05-04 ENCOUNTER — Encounter: Admit: 2019-05-04 | Discharge: 2019-05-04 | Payer: MEDICARE

## 2019-05-04 DIAGNOSIS — R9439 Abnormal result of other cardiovascular function study: Secondary | ICD-10-CM

## 2019-05-04 MED ORDER — METFORMIN 500 MG PO TAB
1000 mg | ORAL_TABLET | Freq: Two times a day (BID) | ORAL | 0 refills | Status: DC
Start: 2019-05-04 — End: 2019-07-08

## 2019-05-04 MED ORDER — METFORMIN 500 MG PO TAB
500 mg | ORAL_TABLET | Freq: Two times a day (BID) | ORAL | 0 refills | Status: DC
Start: 2019-05-04 — End: 2019-05-04

## 2019-05-04 NOTE — Telephone Encounter
Pt notified us that Dr. Adah Perl had instructed her to increase Metformin dosing to 1000mg  BID.    From Dr. Rae Halsted last [place] notes:  1. Try increasing Metformin to 1000 mg twice daily, if GI symptoms or loose stools worsen revert back to 500 mg dose.    Updated prescription and sent in new script to pharmacy.

## 2019-05-08 ENCOUNTER — Encounter: Admit: 2019-05-08 | Discharge: 2019-05-08 | Payer: MEDICARE

## 2019-05-08 MED ORDER — ATORVASTATIN 80 MG PO TAB
ORAL_TABLET | Freq: Every day | 3 refills | Status: AC
Start: 2019-05-08 — End: ?

## 2019-05-13 ENCOUNTER — Encounter: Admit: 2019-05-13 | Discharge: 2019-05-13 | Payer: MEDICARE

## 2019-05-13 NOTE — Telephone Encounter
-----   Message from [email or web address]. Miu sent at 05/13/2019 12:39 PM CST -----  Regarding: Prescription Question  Contact: [phone]  The medicine spironolactone is making my stomach upset to the point that I vomit. Can we change it??

## 2019-05-13 NOTE — Telephone Encounter
Pt states that for the past week she has been having more nausea after taking the spironolactone, and has one episode of emesis on Saturday.  She has noticed that her stomach has been more upset and she has tried taking it with food, but is still having discomfort.  Other than the nausea, she denies any cardiac symptoms such as chest pain, shortness of breath, palpitations or edema.    Her BP at home has been running around 120/78, heart rate in the upper 60s to low 70s.  Her weight is stable at 177-178 pounds.      Last labs 1/7, creatinine 0.86, potassium 4.4 up from 3.4 in December.    Will route to Dr. Adah Perl for recommendations.

## 2019-06-03 ENCOUNTER — Encounter: Admit: 2019-06-03 | Discharge: 2019-06-03 | Payer: MEDICARE

## 2019-06-04 ENCOUNTER — Encounter: Admit: 2019-06-04 | Discharge: 2019-06-04 | Payer: MEDICARE

## 2019-06-04 DIAGNOSIS — E669 Obesity, unspecified: Secondary | ICD-10-CM

## 2019-06-04 DIAGNOSIS — R9439 Abnormal result of other cardiovascular function study: Secondary | ICD-10-CM

## 2019-06-04 DIAGNOSIS — E119 Type 2 diabetes mellitus without complications: Secondary | ICD-10-CM

## 2019-06-04 DIAGNOSIS — E039 Hypothyroidism, unspecified: Secondary | ICD-10-CM

## 2019-06-04 DIAGNOSIS — E1169 Type 2 diabetes mellitus with other specified complication: Secondary | ICD-10-CM

## 2019-06-04 DIAGNOSIS — E785 Hyperlipidemia, unspecified: Secondary | ICD-10-CM

## 2019-06-04 DIAGNOSIS — D352 Benign neoplasm of pituitary gland: Secondary | ICD-10-CM

## 2019-06-04 DIAGNOSIS — G4733 Obstructive sleep apnea (adult) (pediatric): Secondary | ICD-10-CM

## 2019-06-04 NOTE — Patient Instructions
1. Med changes today:  Stop Aldactone.  2. Please write down your weight, blood pressure, and heart rate daily and bring this log to all clinic visits.  3. 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  4. Return to see Dr. Adah Perl in 1 year.    My nurse's Telford Nab and Roselyn Reef) number is [phone].  Please call the office with any questions or concerns [phone] (nurse triage).  To schedule or change an appointment call [phone].    Lise Auer, MD, PhD  [facility] [place] [facility] Phone: [phone] Fax: [phone]

## 2019-06-22 ENCOUNTER — Encounter: Admit: 2019-06-22 | Discharge: 2019-06-22 | Payer: MEDICARE

## 2019-06-22 DIAGNOSIS — E669 Obesity, unspecified: Secondary | ICD-10-CM

## 2019-06-22 DIAGNOSIS — G4733 Obstructive sleep apnea (adult) (pediatric): Secondary | ICD-10-CM

## 2019-06-22 DIAGNOSIS — E039 Hypothyroidism, unspecified: Secondary | ICD-10-CM

## 2019-06-22 DIAGNOSIS — E119 Type 2 diabetes mellitus without complications: Secondary | ICD-10-CM

## 2019-06-22 DIAGNOSIS — R9439 Abnormal result of other cardiovascular function study: Secondary | ICD-10-CM

## 2019-06-22 DIAGNOSIS — E785 Hyperlipidemia, unspecified: Secondary | ICD-10-CM

## 2019-07-08 ENCOUNTER — Encounter: Admit: 2019-07-08 | Discharge: 2019-07-08 | Payer: MEDICARE

## 2019-07-08 DIAGNOSIS — R9439 Abnormal result of other cardiovascular function study: Secondary | ICD-10-CM

## 2019-07-08 MED ORDER — METFORMIN 500 MG PO TAB
ORAL_TABLET | Freq: Two times a day (BID) | 3 refills | Status: AC
Start: 2019-07-08 — End: ?

## 2019-09-24 IMAGING — US CARDUPBI
1 series · 14 of 16 positions shown · non-contrast
Comparison: none

[Series 1: us carotid duplex bi · 14 of 66 slices shown]
[im 1/66]
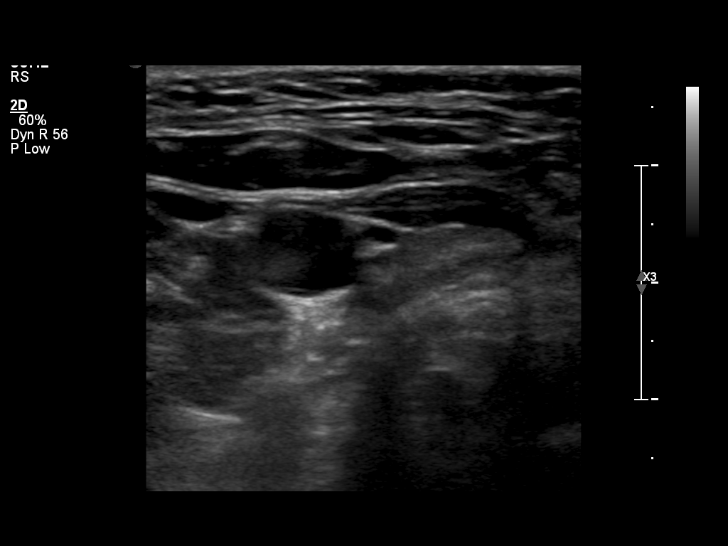
[im 5/66]
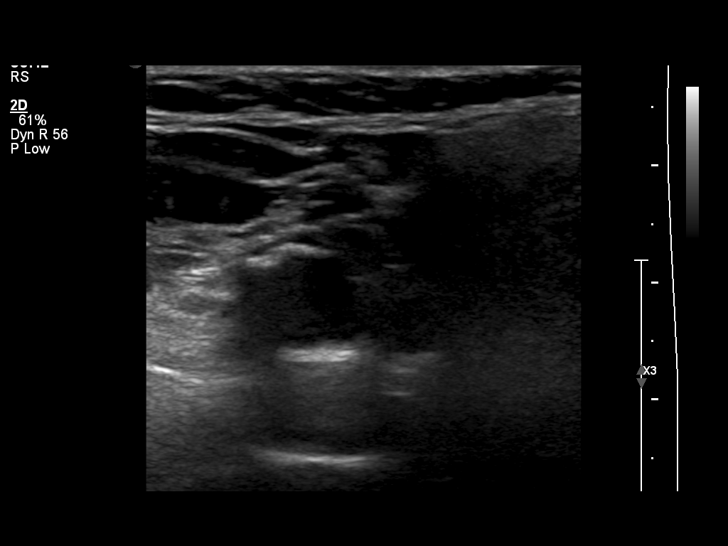
[im 9/66]
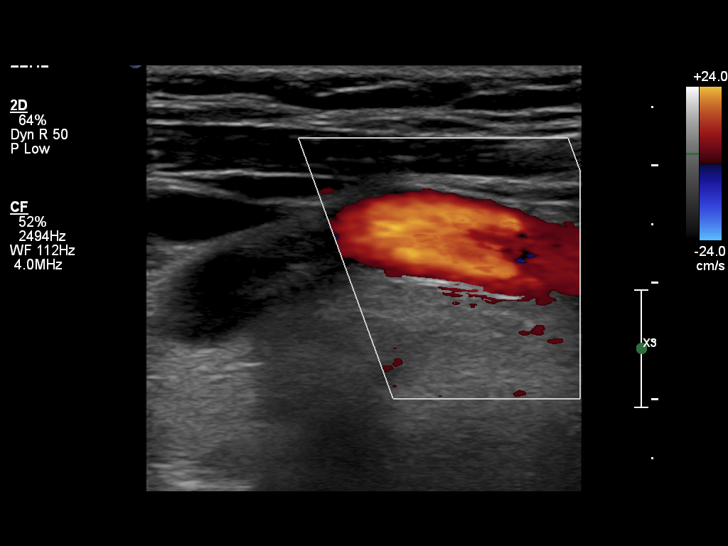
[im 18/66]
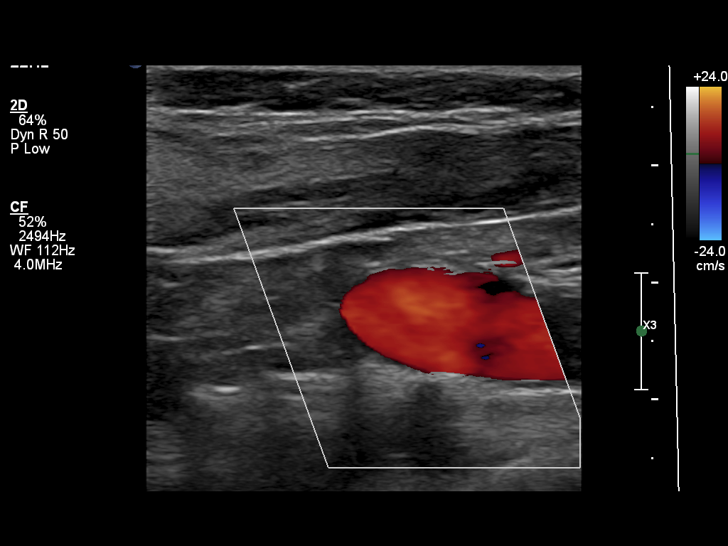
[im 22/66]
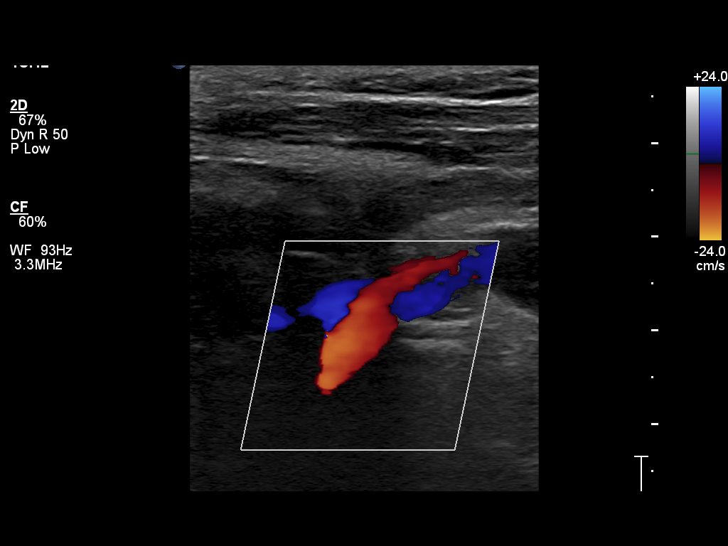
[im 27/66]
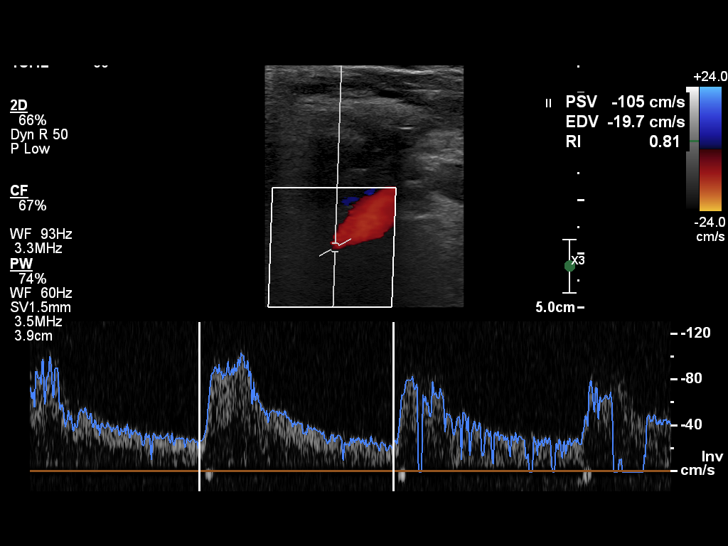
[im 31/66]
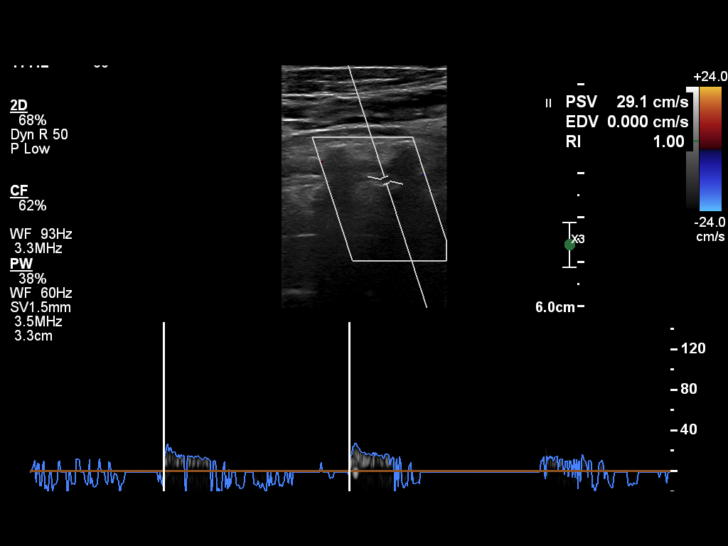
[im 35/66]
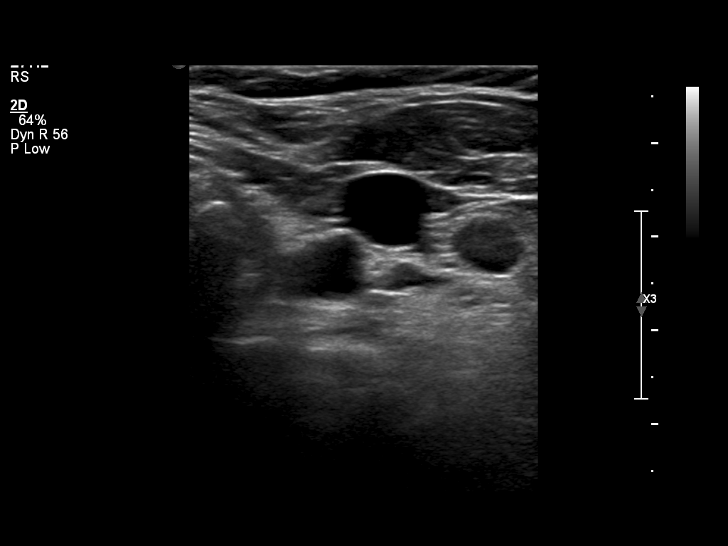
[im 40/66]
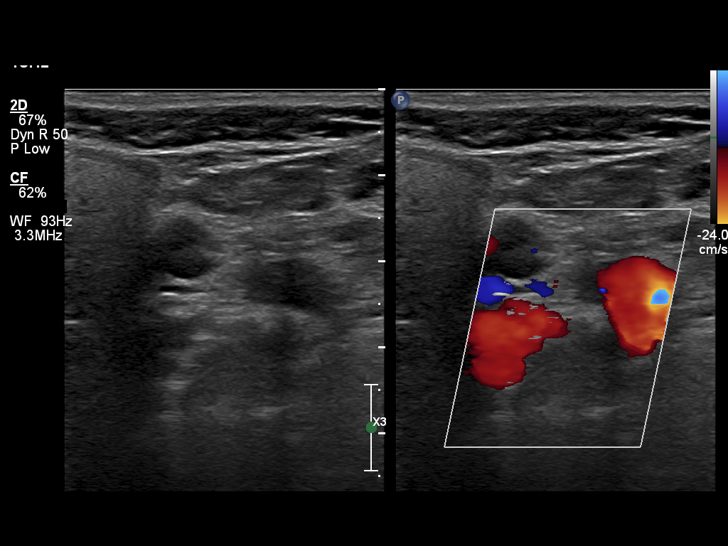
[im 44/66]
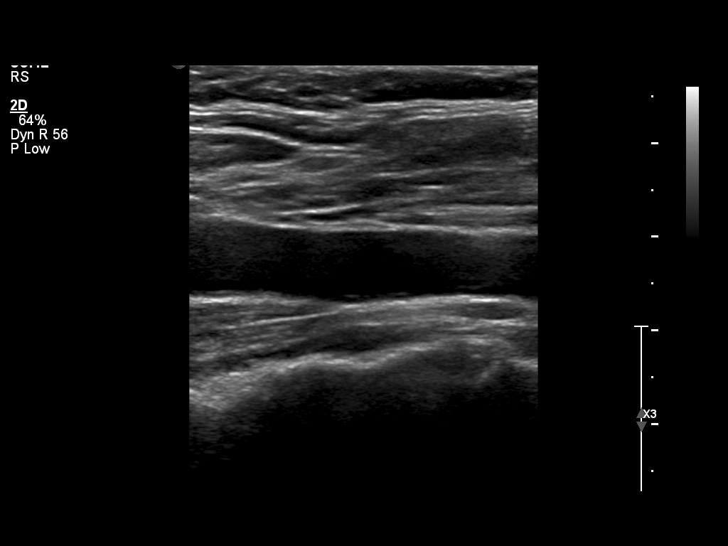
[im 53/66]
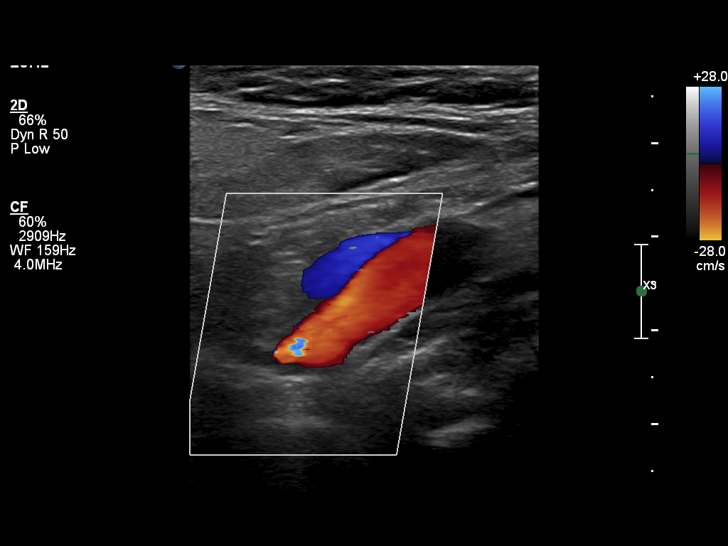
[im 57/66]
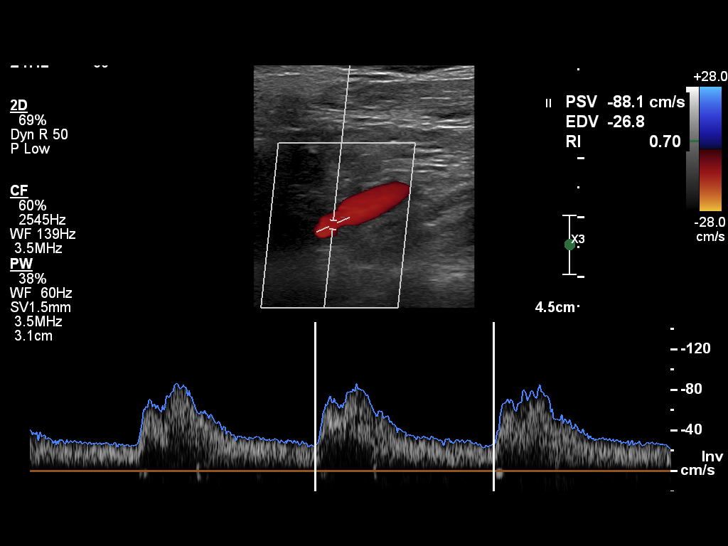
[im 61/66]
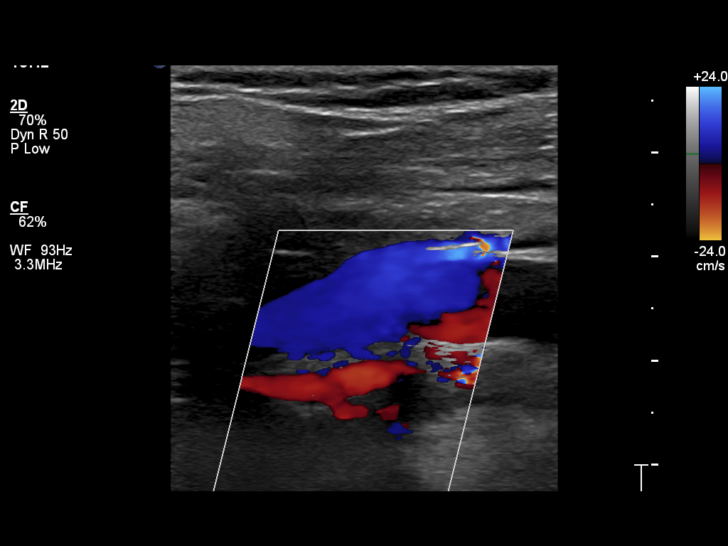
[im 66/66]
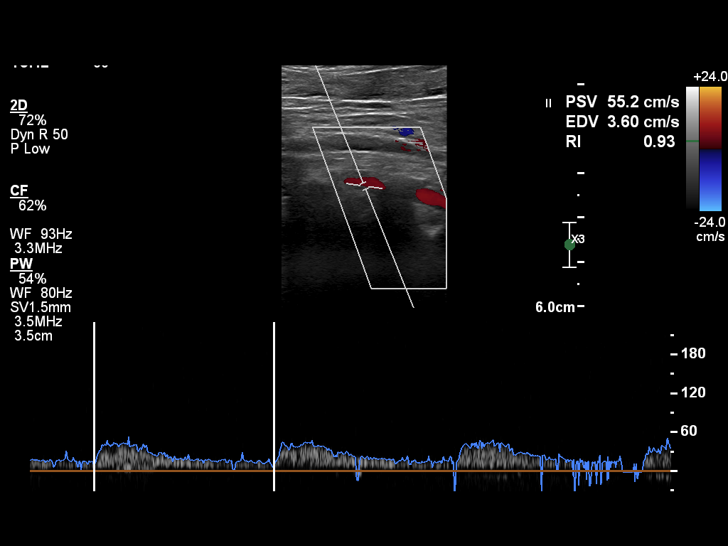

[14 of 16 positions shown; findings below may reference images not displayed]

EXAM
DUPLEX SCAN OF EXTRACRANIAL ARTERIES; COMPLETE BILATERAL STUDY, CPT 54669

INDICATION
Hypertenison/bruitJL

TECHNIQUE
Multiple static grayscale and color Doppler ultrasound images are provided for a bilateral carotid
ultrasound.

COMPARISONS
[There are no previous examinations available for comparison at the time of dictation.

FINDINGS
RIGHT CAROTID SYSTEM: There is mild calcified plaque in the right carotid bulb. Common carotid
artery velocity is 143cm/sec.
Internal carotid artery peak systolic velocity is 105cm/sec
with end-diastolic velocity of 97cm/sec.
ICA/CCA ratio is 0.73.
LEFT CAROTID SYSTEM: There is no evidence of significant calcified plaque in the left carotid
system. Common carotid artery velocity is 96cm/sec.
Internal carotid artery peak systolic velocity is 88cm/sec
with end-diastolic velocity of 27cm/sec.
ICA/CCA ratio is 0.92.
Bilateral vertebral artery flow is antegrade.

IMPRESSION
Minimal calcified plaque in the right carotid bulb. No hemodynamically significant common or
internal carotid artery stenosis. NASCET criteria were utilized.

Tech Notes:

jl

## 2019-11-24 NOTE — Patient Instructions
It was great to talk to you today.    Issues we are addressing today:      Our plan:        If you are signed up for myChart, results will be released to you as soon as they are available.  You will be able to review your results before I have the opportunity to review your results.  However, I will review all results with feedback via myChart.    ? Please call my nurse between appointments with any issues --  at [phone].  ? If you need to contact radiology schedule, they are at:  [phone].  ? If you need to contact the [facility], they are at:  [phone].    Your satisfaction with the care you received today is of utmost importance.  Please contact me directly via email if there are any issues: [email or web address]    Again, it was great to talk you today.    Best,   Anders Simmonds, MBBS  Endocrine Fellow

## 2019-11-30 ENCOUNTER — Ambulatory Visit: Admit: 2019-11-30 | Discharge: 2019-12-01 | Payer: MEDICARE

## 2019-11-30 ENCOUNTER — Encounter: Admit: 2019-11-30 | Discharge: 2019-11-30 | Payer: MEDICARE

## 2019-11-30 DIAGNOSIS — E039 Hypothyroidism, unspecified: Secondary | ICD-10-CM

## 2019-11-30 DIAGNOSIS — R9439 Abnormal result of other cardiovascular function study: Secondary | ICD-10-CM

## 2019-11-30 DIAGNOSIS — E785 Hyperlipidemia, unspecified: Secondary | ICD-10-CM

## 2019-11-30 DIAGNOSIS — E119 Type 2 diabetes mellitus without complications: Secondary | ICD-10-CM

## 2019-11-30 DIAGNOSIS — E669 Obesity, unspecified: Secondary | ICD-10-CM

## 2019-11-30 DIAGNOSIS — G4733 Obstructive sleep apnea (adult) (pediatric): Secondary | ICD-10-CM

## 2019-12-01 ENCOUNTER — Encounter: Admit: 2019-12-01 | Discharge: 2019-12-01 | Payer: MEDICARE

## 2019-12-01 DIAGNOSIS — E785 Hyperlipidemia, unspecified: Secondary | ICD-10-CM

## 2019-12-01 DIAGNOSIS — E119 Type 2 diabetes mellitus without complications: Secondary | ICD-10-CM

## 2019-12-01 DIAGNOSIS — D352 Benign neoplasm of pituitary gland: Secondary | ICD-10-CM

## 2019-12-01 DIAGNOSIS — R9439 Abnormal result of other cardiovascular function study: Secondary | ICD-10-CM

## 2019-12-01 DIAGNOSIS — E039 Hypothyroidism, unspecified: Secondary | ICD-10-CM

## 2019-12-01 DIAGNOSIS — G4733 Obstructive sleep apnea (adult) (pediatric): Secondary | ICD-10-CM

## 2019-12-01 DIAGNOSIS — E669 Obesity, unspecified: Secondary | ICD-10-CM

## 2019-12-01 DIAGNOSIS — E1169 Type 2 diabetes mellitus with other specified complication: Secondary | ICD-10-CM

## 2019-12-16 ENCOUNTER — Encounter: Admit: 2019-12-16 | Discharge: 2019-12-16 | Payer: MEDICARE

## 2019-12-23 ENCOUNTER — Encounter: Admit: 2019-12-23 | Discharge: 2019-12-23 | Payer: MEDICARE

## 2019-12-28 ENCOUNTER — Encounter: Admit: 2019-12-28 | Discharge: 2019-12-28 | Payer: MEDICARE

## 2019-12-28 LAB — SOMATOMEDIN C, IGF-1
Lab: 0 (ref -2.0–2.0)
Lab: 91 ng/mL (ref 41–279)

## 2019-12-29 ENCOUNTER — Encounter: Admit: 2019-12-29 | Discharge: 2019-12-29 | Payer: MEDICARE

## 2019-12-29 DIAGNOSIS — Z1321 Encounter for screening for nutritional disorder: Secondary | ICD-10-CM

## 2019-12-29 DIAGNOSIS — Z9889 Other specified postprocedural states: Secondary | ICD-10-CM

## 2019-12-29 DIAGNOSIS — Z87442 Personal history of urinary calculi: Secondary | ICD-10-CM

## 2019-12-29 LAB — 25-OH VITAMIN D (D2 + D3)
Lab: 19 ng/mL
Lab: 19 ng/mL — AB (ref 30–100)

## 2020-01-30 ENCOUNTER — Encounter: Admit: 2020-01-30 | Discharge: 2020-01-30 | Payer: MEDICARE

## 2020-01-30 MED ORDER — FUROSEMIDE 20 MG PO TAB
ORAL_TABLET | 3 refills | PRN
Start: 2020-01-30 — End: ?

## 2020-03-01 ENCOUNTER — Encounter: Admit: 2020-03-01 | Discharge: 2020-03-01 | Payer: MEDICARE

## 2020-03-01 MED ORDER — CARVEDILOL 6.25 MG PO TAB
6.25 mg | ORAL_TABLET | Freq: Two times a day (BID) | ORAL | 3 refills | 90.00000 days | Status: AC
Start: 2020-03-01 — End: ?

## 2020-03-04 ENCOUNTER — Encounter: Admit: 2020-03-04 | Discharge: 2020-03-04 | Payer: MEDICARE

## 2020-03-04 ENCOUNTER — Inpatient Hospital Stay: Admit: 2020-03-04 | Discharge: 2020-03-04 | Payer: MEDICARE

## 2020-03-04 DIAGNOSIS — S065X9A Traumatic subdural hemorrhage with loss of consciousness of unspecified duration, initial encounter: Secondary | ICD-10-CM

## 2020-03-04 LAB — COMPREHENSIVE METABOLIC PANEL
Lab: 0.6 mg/dL (ref 0.4–1.00)
Lab: 1.5 mg/dL — ABNORMAL HIGH (ref 0.3–1.2)
Lab: 100 MMOL/L (ref 98–110)
Lab: 12 mg/dL (ref 7–25)
Lab: 14 — ABNORMAL HIGH (ref 3–12)
Lab: 141 MMOL/L (ref 137–147)
Lab: 152 mg/dL — ABNORMAL HIGH (ref 70–100)
Lab: 22 U/L (ref 7–56)
Lab: 23 U/L (ref 7–40)
Lab: 27 MMOL/L (ref 21–30)
Lab: 4.2 g/dL (ref 3.5–5.0)
Lab: 4.3 MMOL/L (ref 3.5–5.1)
Lab: 6.9 g/dL (ref 6.0–8.0)
Lab: 69 U/L (ref 25–110)
Lab: 9.2 mg/dL (ref 8.5–10.6)
Lab: >60 mL/min (ref >60)
Lab: >60 mL/min (ref >60)

## 2020-03-04 LAB — PHOSPHORUS: Lab: 4.4 mg/dL (ref 2.0–4.5)

## 2020-03-04 LAB — LACTIC ACID(LACTATE): Lab: 2.7 MMOL/L — ABNORMAL HIGH (ref 0.5–2.0)

## 2020-03-04 LAB — PTT (APTT): Lab: 27 s (ref 24.0–36.5)

## 2020-03-04 LAB — PROTIME INR (PT): Lab: 1.1 pg (ref 0.8–1.2)

## 2020-03-04 LAB — POC GLUCOSE: Lab: 134 mg/dL — ABNORMAL HIGH (ref 70–100)

## 2020-03-04 LAB — MAGNESIUM: Lab: 1.7 mg/dL (ref 1.6–2.6)

## 2020-03-04 LAB — CBC: Lab: 13 K/UL — ABNORMAL HIGH (ref 4.5–11.0)

## 2020-03-04 LAB — IONIZED CALCIUM: Lab: 1.1 MMOL/L (ref 1.0–1.3)

## 2020-03-04 MED ORDER — LACTATED RINGERS IV SOLP
1000 mL | INTRAVENOUS | 0 refills | Status: CP
Start: 2020-03-04 — End: ?
  Administered 2020-03-05: 03:00:00 1000 mL via INTRAVENOUS

## 2020-03-04 MED ORDER — LABETALOL 5 MG/ML IV SYRG
10 mg | INTRAVENOUS | 0 refills | Status: DC | PRN
Start: 2020-03-04 — End: 2020-03-05

## 2020-03-04 MED ORDER — ONDANSETRON HCL (PF) 4 MG/2 ML IJ SOLN
4 mg | INTRAVENOUS | 0 refills | Status: AC | PRN
Start: 2020-03-04 — End: ?
  Administered 2020-03-05 (×2): 4 mg via INTRAVENOUS

## 2020-03-04 MED ORDER — ALBUTEROL SULFATE 90 MCG/ACTUATION IN HFAA
2 | RESPIRATORY_TRACT | 0 refills | Status: AC | PRN
Start: 2020-03-04 — End: ?

## 2020-03-04 MED ORDER — DOCUSATE SODIUM 100 MG PO CAP
100 mg | Freq: Two times a day (BID) | ORAL | 0 refills | Status: AC
Start: 2020-03-04 — End: ?
  Administered 2020-03-05 – 2020-03-07 (×5): 100 mg via ORAL

## 2020-03-04 MED ORDER — OXYCODONE 5 MG PO TAB
5 mg | ORAL | 0 refills | Status: AC | PRN
Start: 2020-03-04 — End: ?

## 2020-03-04 MED ORDER — CARVEDILOL 6.25 MG PO TAB
6.25 mg | Freq: Two times a day (BID) | ORAL | 0 refills | Status: AC
Start: 2020-03-04 — End: ?
  Administered 2020-03-05 – 2020-03-07 (×5): 6.25 mg via ORAL

## 2020-03-04 MED ORDER — LEVETIRACETAM 500 MG/5 ML IV SOLN
1000 mg | Freq: Two times a day (BID) | INTRAVENOUS | 0 refills | Status: DC
Start: 2020-03-04 — End: 2020-03-05
  Administered 2020-03-05: 02:00:00 1000 mg via INTRAVENOUS

## 2020-03-04 MED ORDER — FENTANYL CITRATE (PF) 50 MCG/ML IJ SOLN
25-50 ug | INTRAVENOUS | 0 refills | Status: DC | PRN
Start: 2020-03-04 — End: 2020-03-05

## 2020-03-04 MED ORDER — CARVEDILOL 6.25 MG PO TAB
6.25 mg | Freq: Two times a day (BID) | ORAL | 0 refills | Status: DC
Start: 2020-03-04 — End: 2020-03-05

## 2020-03-04 MED ORDER — ACETAMINOPHEN 325 MG PO TAB
650 mg | ORAL | 0 refills | Status: AC | PRN
Start: 2020-03-04 — End: ?
  Administered 2020-03-05 – 2020-03-07 (×6): 650 mg via ORAL

## 2020-03-04 MED ORDER — POLYETHYLENE GLYCOL 3350 17 GRAM PO PWPK
1 | Freq: Every day | ORAL | 0 refills | Status: AC
Start: 2020-03-04 — End: ?
  Administered 2020-03-05 – 2020-03-07 (×3): 17 g via ORAL

## 2020-03-04 MED ORDER — LABETALOL 5 MG/ML IV SYRG
10 mg | INTRAVENOUS | 0 refills | Status: AC | PRN
Start: 2020-03-04 — End: ?
  Administered 2020-03-05: 03:00:00 10 mg via INTRAVENOUS

## 2020-03-04 MED ORDER — BACITRACIN ZINC 500 UNIT/GRAM TP OINT
Freq: Two times a day (BID) | TOPICAL | 0 refills | Status: AC
Start: 2020-03-04 — End: ?
  Administered 2020-03-05: 02:00:00 via TOPICAL

## 2020-03-04 MED ORDER — LEVETIRACETAM 500 MG PO TAB
1000 mg | Freq: Two times a day (BID) | ORAL | 0 refills | Status: AC
Start: 2020-03-04 — End: ?
  Administered 2020-03-05 – 2020-03-07 (×5): 1000 mg via ORAL

## 2020-03-04 MED ORDER — INSULIN ASPART 100 UNIT/ML SC FLEXPEN
0-6 [IU] | Freq: Before meals | SUBCUTANEOUS | 0 refills | Status: AC
Start: 2020-03-04 — End: ?
  Administered 2020-03-07: 04:00:00 1 [IU] via SUBCUTANEOUS

## 2020-03-04 MED ORDER — LACTATED RINGERS IV SOLP
INTRAVENOUS | 0 refills | Status: DC
Start: 2020-03-04 — End: 2020-03-05
  Administered 2020-03-05: 02:00:00 1000.000 mL via INTRAVENOUS

## 2020-03-04 MED ORDER — HYDRALAZINE 20 MG/ML IJ SOLN
10 mg | INTRAVENOUS | 0 refills | Status: DC | PRN
Start: 2020-03-04 — End: 2020-03-05
  Administered 2020-03-05: 02:00:00 10 mg via INTRAVENOUS

## 2020-03-04 MED ORDER — HYDRALAZINE 20 MG/ML IJ SOLN
10 mg | INTRAVENOUS | 0 refills | Status: AC | PRN
Start: 2020-03-04 — End: ?

## 2020-03-04 MED ORDER — AMOXICILLIN-POT CLAVULANATE 875-125 MG PO TAB
875 mg | Freq: Two times a day (BID) | ORAL | 0 refills | Status: AC
Start: 2020-03-04 — End: ?
  Administered 2020-03-05 – 2020-03-07 (×6): 875 mg via ORAL

## 2020-03-04 NOTE — Progress Notes
Patient is in ED with after sustaining a fall from standing position resulting in small SDH and hand contusion.

## 2020-03-05 ENCOUNTER — Inpatient Hospital Stay: Admit: 2020-03-05 | Payer: MEDICARE

## 2020-03-05 ENCOUNTER — Inpatient Hospital Stay: Admit: 2020-03-05 | Discharge: 2020-03-05 | Payer: MEDICARE

## 2020-03-05 MED ADMIN — MAGNESIUM SULFATE IN WATER 4 GRAM/50 ML (8 %) IV PGBK [166563]: 4 g | INTRAVENOUS | @ 11:00:00 | Stop: 2020-03-05 | NDC 00338171940

## 2020-03-05 MED ADMIN — ESCITALOPRAM OXALATE 20 MG PO TAB [86690]: 20 mg | ORAL | @ 16:00:00 | NDC 00904642761

## 2020-03-05 MED ADMIN — LEVOTHYROXINE 75 MCG PO TAB [4422]: 75 ug | ORAL | @ 14:00:00 | NDC 51079044101

## 2020-03-05 NOTE — Consults
Neurosurgery Trauma Consult History and Physical Note      Admission Date: 03/04/2020                                                LOS: 0 days    Reason for Consult:  TSAH, subdural    Consult type: Opinion with orders    Consulting Physician: Neurosurgery Attendings: Sherrine Maples, MD    Requesting Physician: Trauma   Meghan Carlson is a 69 y.o. female who presents as a transfer from an OSH after she fell in the parking lot and was found to have traumatic subarachnoid blood.     - No acute neurosurgical interventions  - Maintain INR < 1.4, Platelets > 80k  - Maintain SBP < 140  - Avoid anticoagulation  - Recommend minimizing sedation where possible. Avoid opioid and benzodiazepine drips   - Maintain HOB >30 degrees when stable from spine and medical standpoint. Defer to primary  - Maintain normonatremia  - Neurochecks Q1H  - Keppra 500mg  BID for seizure ppx x7 days   - 6hr Stability CT scan   - 24hr stability CT scan       ____________________________________________________________________    History of Present Illness:   Meghan Carlson is a 69 y.o. right handed female who presents as a transfer from an OSH where she presented following a fall. The patient reports that she was shopping when she tripped over an object and landed on her face. The patient states that she did not have any LOC. She reports that she had some nausea and emesis at the outside facility but that has since resolved since she has been at [facility]. The patient also denies any changes in vision, numbness, or sensory changes. She had a CT head that showed a small subdural she was transferred here for higher level care. She reports no use of blood thinners.     Past Medical History:  Medical History:   Diagnosis Date   ? Abnormal stress test 12/11/2017   ? Diabetes mellitus (HCC) 11/11/2017   ? Dyslipidemia 11/11/2017   ? Hypothyroid    ? Obesity 11/11/2017   ? OSA (obstructive sleep apnea) 11/11/2017       Past Surgical History:  Surgical History:   Procedure Laterality Date   ? 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 ANGIOGRAPHY CORONARY ARTERY WITH LEFT HEART CATHETERIZATION N/A 12/11/2017    Performed by Greig Castilla, MD at [facility] CATH LAB   ? POSSIBLE PERCUTANEOUS CORONARY STENT PLACEMENT WITH ANGIOPLASTY N/A 12/11/2017    Performed by Greig Castilla, MD at [facility] CATH LAB   ? CESAREAN SECTION, LOW TRANSVERSE     ? KIDNEY STONE SURGERY     ? KNEE REPLACEMENT Right        Social History:  Social History     Tobacco Use   ? Smoking status: Never Smoker   ? Smokeless tobacco: Never Used   Substance Use Topics   ? Alcohol use: Not Currently   ? Drug use: Never       Family History:  Family History   Problem Relation Age of Onset   ? Stroke Mother 26   ? Liver Disease Father        Allergies:    Peanut, Lisinopril, Seasonal allergies, and Sulfonamide [sulfa (sulfonamide antibiotics)]  Medications:  PTA:  Medications Prior to Admission   Medication Sig   ? albuterol (VENTOLIN HFA) 90 mcg/actuation inhaler Inhale 2 puffs by mouth into the lungs every 6 hours as needed for Wheezing or Shortness of Breath. Shake well before use.   ? albuterol 0.083% (PROVENTIL; VENTOLIN) 2.5 mg /3 mL (0.083 %) nebulizer solution Inhale 3 mL solution by nebulizer as directed every 4 hours as needed for Wheezing or Shortness of Breath.   ? atorvastatin (LIPITOR) 80 mg tablet TAKE 1 TABLET BY MOUTH EVERY DAY   ? carvediloL (COREG) 6.25 mg tablet TAKE ONE TABLET BY MOUTH TWICE DAILY WITH MEALS. TAKE WITH FOOD.   ? diclofenac sodium (VOLTAREN-XR) 100 mg xr tablet Take 100 mg by mouth daily. Take with food.   ? escitalopram oxalate (LEXAPRO) 20 mg tablet Take 20 mg by mouth daily.   ? fluticasone propionate (FLONASE) 50 mcg/actuation nasal spray Apply  to each nostril as directed as Needed. Shake bottle gently before using.    ? furosemide (LASIX) 20 mg tablet TAKE 2 TABLETS BY MOUTH IN THE MORNING AS NEEDED FOR SWELLING OR WEIGHT GAIN   ? levothyroxine (SYNTHROID) 50 mcg tablet Take 50 mcg by mouth daily 30 minutes before breakfast.   ? losartan (COZAAR) 50 mg tablet Take one tablet by mouth daily.   ? metFORMIN (GLUCOPHAGE) 500 mg tablet TAKE TWO TABLETS BY MOUTH TWICE DAILY WITH MEALS.   ? nitroglycerin (NITROSTAT) 0.4 mg tablet 1 tab under tongue as needed for chest pain, may repeat in 5 min x 2   ? oxycodone(+) (ROXICODONE, OXY-IR) 20 mg tablet Take 20 mg by mouth every 6 hours as needed   ? triamcinolone acetonide (KENALOG) 0.1 % topical cream Apply  topically to affected area as Needed.   ? valACYclovir (VALTREX) 500 mg tablet Take 500 mg by mouth every 24 hours.       Inpatient:  Scheduled Meds:bacitracin topical ointment, , Topical, BID  carvediloL (COREG) tablet 6.25 mg, 6.25 mg, Oral, BID w/meals  insulin aspart (U-100) (NOVOLOG FLEXPEN U-100 INSULIN) injection PEN 0-6 Units, 0-6 Units, Subcutaneous, ACHS (22)  [START ON 03/05/2020] levETIRAcetam (KEPPRA) tablet 1,000 mg, 1,000 mg, Oral, BID    Continuous Infusions:  PRN and Respiratory Meds:acetaminophen Q4H PRN, hydrALAZINE Q6H PRN, labetalol (NORMODYNE; TRANDATE) injection Q2H PRN, ondansetron (ZOFRAN) IV Q6H PRN, oxyCODONE Q6H PRN      Review of Systems:  Full 10 point review of systems negative except for HPI    Physical Exam:  Vital Signs:  Last Filed in 24 hours Vital Signs:  24 hour Range    BP: 155/80 (11/26 2000)  Temp: 37 ?C (98.6 ?F) (11/26 1950)  Pulse: 64 (11/26 2000)  Respirations: 20 PER MINUTE (11/26 2000)  SpO2: 96 % (11/26 2000)  SpO2 Pulse: 65 (11/26 2000)  Height: 160 cm (63) (11/26 1935) BP: (155-169)/(80)   Temp:  [37 ?C (98.6 ?F)]   Pulse:  [64-72]   Respirations:  [20 PER MINUTE-28 PER MINUTE]   SpO2:  [96 %-98 %]      General Appearance: No acute distress  Lungs: Good air movement and equal chest rise bilaterally  Heart: Regular rate and rhythm  Abdomen: Soft, non-tender, no masses,  no organomegaly  Extremities: No edema, redness or tenderness in the calves or thighs    Neurologic Exam:   Mental Status: Awake, alert and oriented x 4, fluent speech  Pupils: Pupils equal round and reactive to light  Cranial Nerves: CN II-XII  individually tested and found to be intact, gag not tested  Motor:   AA EF EE G HF KF KE DF PF EHL   Left 5 5 5 5 5 5 5 5 5 5    Right 5 5 5 5 5 5 5 5 5 5    Normal muscle bulk and tone  No pronator drift  Sensation: Sensation intact to light touch throughout  Deep Tendon Reflexes:   Bi Br Pa Ac   Left 2 2 2 2    Right 2 2 2 2    Plantar responses toes downgoing bilaterally  No clonus bilaterally  No hoffman's sign bilaterally  Gait: Normal gait, with tandem, heel, and toe walk  Cerebellar: No dysmetria, No dysdiadochokinesia  Rectal: Normal tone with positive anal cutaneous reflex and positve bulbocavernosus reflex    LabTests:  Hematology:    Lab Results   Component Value Date    HGB 12.8 03/04/2020    HCT 39.3 03/04/2020    PLTCT 289 03/04/2020    WBC 13.7 03/04/2020    MCV 91.3 03/04/2020    MCHC 32.7 03/04/2020    MPV 9.1 03/04/2020    RDW 14.1 03/04/2020     General Chemistry:   Lab Results   Component Value Date    NA 141 03/04/2020    K 4.3 03/04/2020    CL 100 03/04/2020    CO2 27 03/04/2020    BUN 12 03/04/2020    CR 0.63 03/04/2020    GLU 152 03/04/2020    OBSCA 1.16 03/04/2020    CA 9.2 03/04/2020    MG 1.7 03/04/2020    PO4 4.4 03/04/2020     General Chemistry:   Lab Results   Component Value Date    GAP 14 03/04/2020    ALBUMIN 4.2 03/04/2020    LACTIC 2.7 03/04/2020    TOTBILI 1.5 03/04/2020    TOTPROT 6.9 03/04/2020    AST 23 03/04/2020    ALT 22 03/04/2020    ALKPHOS 69 03/04/2020             Assessment/Plan:      Fidel Levy, MD

## 2020-03-05 NOTE — Consults
Plastic Surgery Consult  03/04/2020     Patient: Meghan Carlson  MRN: [id number]    Admission Date:  03/04/2020, LOS: 0 days  Admission Diagnosis: Subdural hematoma [facility]) [S06.5X9A]  Date of Service: March 04, 2020    ASSESSMENT: 69 y.o. female with fracture of the medial wall of the right orbit s/p mechanical fall     PLAN:   - No acute operative intervention    - Agree with ophthalmology consult    - Recommend Augmentin x 7 days    - Sinus precautions: Avoid drinking through a straw, smoking, strenuous exercise, do not open  mouth widely and no nose blowing    - May follow-up in clinic with Raliegh Ip, PA in 2 weeks, [phone] to schedule    Discussed with staff surgeon, Dr. Thomasena Edis, who directed plan of care  __________________________________________________________________________________    History of Present Illness: Meghan Carlson is a 69 y.o. Female who presents as a trauma transfer s/p fall. Patient reports that around noon today she was in the parking lot of a store when she tripped and fell. No LOC. She was evaluated at OSH where she was found to have a small subdural hematoma and medial orbital wall fracture. She reports pain to her face, shoulder and arms. Patient denies any changes in vision, nasal pain, malocclusion. No history of prior face trauma or surgery.     Past Medical History  Meghan Carlson has a past medical history of Abnormal stress test (12/11/2017), Diabetes mellitus (HCC) (11/11/2017), Dyslipidemia (11/11/2017), Hypothyroid, Obesity (11/11/2017), and OSA (obstructive sleep apnea) (11/11/2017).    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    Family History  Meghan Carlson's family history includes Liver Disease in her father; Stroke (age of onset: 51) in her mother.    Social History    Asal  reports that she has never smoked. She has never used smokeless tobacco. She reports previous alcohol use. She reports that she does not use drugs.    Allergies  Peanut, Lisinopril, Seasonal allergies, and Sulfonamide [sulfa (sulfonamide antibiotics)]    Medications  No current facility-administered medications on file prior to encounter.     Current Outpatient Medications on File Prior to Encounter   Medication Sig Dispense Refill   ? albuterol (VENTOLIN HFA) 90 mcg/actuation inhaler Inhale 2 puffs by mouth into the lungs every 6 hours as needed for Wheezing or Shortness of Breath. Shake well before use.     ? albuterol 0.083% (PROVENTIL; VENTOLIN) 2.5 mg /3 mL (0.083 %) nebulizer solution Inhale 3 mL solution by nebulizer as directed every 4 hours as needed for Wheezing or Shortness of Breath.     ? atorvastatin (LIPITOR) 80 mg tablet TAKE 1 TABLET BY MOUTH EVERY DAY 90 tablet 3   ? carvediloL (COREG) 6.25 mg tablet TAKE ONE TABLET BY MOUTH TWICE DAILY WITH MEALS. TAKE WITH FOOD. 180 tablet 3   ? diclofenac sodium (VOLTAREN-XR) 100 mg xr tablet Take 100 mg by mouth daily. Take with food.     ? escitalopram oxalate (LEXAPRO) 20 mg tablet Take 20 mg by mouth daily.     ? fluticasone propionate (FLONASE) 50 mcg/actuation nasal spray Apply  to each nostril as directed as Needed. Shake bottle gently before using.      ? furosemide (LASIX) 20 mg tablet TAKE 2 TABLETS BY MOUTH IN THE  MORNING AS NEEDED FOR SWELLING OR WEIGHT GAIN 180 tablet 3   ? levothyroxine (SYNTHROID) 50 mcg tablet Take 50 mcg by mouth daily 30 minutes before breakfast.     ? losartan (COZAAR) 50 mg tablet Take one tablet by mouth daily. 90 tablet 3   ? metFORMIN (GLUCOPHAGE) 500 mg tablet TAKE TWO TABLETS BY MOUTH TWICE DAILY WITH MEALS. 360 tablet 3   ? nitroglycerin (NITROSTAT) 0.4 mg tablet 1 tab under tongue as needed for chest pain, may repeat in 5 min x 2 25 tablet 3   ? oxycodone(+) (ROXICODONE, OXY-IR) 20 mg tablet Take 20 mg by mouth every 6 hours as needed     ? triamcinolone acetonide (KENALOG) 0.1 % topical cream Apply  topically to affected area as Needed.     ? valACYclovir (VALTREX) 500 mg tablet Take 500 mg by mouth every 24 hours.         Review of Systems:  A complete 14 system ROS was obtained and was negative except for as stated in HPI.    Physical Exam:  Blood pressure (!) 160/136, pulse 85, temperature 37 ?C (98.6 ?F), height 160 cm (63), weight 86.8 kg (191 lb 5.8 oz), SpO2 97 %.  -General: Alert, conversant. Appears comfortable.  -Neck: Supple  -Respiratory: Non-labored respirations.   -CardioVascular: Extremities appear well perfused.   -Psychiatric: Appropriate mood and affect.     Face Exam  General: No obvious deformity or asymmetry.  Skin: Superficial abrasion over lateral right eyebrow. Ecchymosis and edema around right eye and right upper lip.    Nerves: Sensation intact and equal bilaterally in V1-V3 distributions. Facial movements intact and equal in all facial nerve distributions bilaterally.  Eyes: Normal eye opening. No chemosis or subconjunctival hemorrhage.  Extraocular movements intact without pain or diplopia. Vision grossly intact bilaterally. No blurry vision, loss of vision, diplopia.  Nose: Nasal spine midline, no bony instability, step-offs, crepitance. Septum midline without septal hematoma. No bloody or clear rhinorrhea appreciated.   Mouth: No maxillary or mandibular tenderness, step-offs, deformities. Normal occlusion. No intra-oral lacerations.  No loose or missing teeth. No TMJ tenderness.  Ears: Normal appearing ears bilaterally. No laceration or hematoma.    Pertinent Imaging:  OSH CT: fracture of the medial wall of the right orbit with fluid in the right ethmoid sinuses     Bertram Gala, MD  Pager: [phone]

## 2020-03-05 NOTE — Progress Notes
Patient transfer from OSH after tripping and falling forward hitting right side of face on concrete parking lot. Right Orbital FX, Right SDH. NO LOC. NO blood thinners. C-Spine cleared by OSH. COVID Negative at OSH.  A&Ox4, F/C. Blood pressure high, 150-200 SBP. Received 10 mg labetalol at 1730. Complains of headache, 50 mcg of fentanyl given, causing patient to vomit, 4 mg Zofran given.

## 2020-03-05 NOTE — Consults
Admission History and Physical Note      Name:  Meghan Carlson                       MRN:  [id number]                Admission Date: 03/04/2020                     Primary Care Physician: Burnadette Pop      Reason of Consult: Geriatric trauma    Assessment/Plan:      Meghan Carlson is a 69 y.o. female with significant history of coronary artery disease, chronic heart failure with preserved EF, biventricular nonischemic cardiomyopathy, hypothyroidism, pituitary edema is transferred from [facility].     1.  Mechanical fall  -Small subdural hemorrhage on CT head.  Neurosurgery following  -Right orbital fracture.  Ophthalmology consult  -Repeat CT head 6 hours ordered.  Awaiting reads of outside hospital scans.  -Monitor closely with neuro checks.  -N.p.o.  -Pain control per primary team  -Continue Keppra  -Holding anticoagulation per neurosurgery    2. Biventricular nonischemic cardiomyopathy   -Stable?coronary disease?discovered?in 2019   -Hypertension  -Abnormal stress test.   -Her echocardiogram performed 11/27/2017 showed an EF of 45 to 50% with mild LVH, mild diastolic dysfunction, no significant valvular disease, and mild right-sided dysfunction.??  -Coronary angiography 12/11/2017 revealed no obstructive lesions in the major vessels, though she did have a 90% lesion in the very small diagonal. ?  -Carotid doppler ordered 02/2018 showed no hemodynamically significant stenosis and minimal plaque.?  -Suspected sleep apnea  -Home medication include Coreg 6.25 mg twice daily, losartan 50 mg daily, Lasix 20 mg as needed for weight gain  -Agree with continuing Coreg 6.25 for now and will be adding rest of medication gradually  -Monitor her volume status closely    3.  Hypothyroidism.  Continue levothyroxine 50 mcg  ?  4. Diabetes Mellitus. Follows with Endocrine here.   -Home medication include Metformin.  Which we will hold and continue sliding scale    5. Lexapro 20 mg daily ?    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  Underwent pituitary surgery on 1985.  Post surgery her amenorrhea resolved and not on any hormonal replacement    7.  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  8.  Leukocytosis.  Monitor WBC.  No obvious sign of infection.  Likely reactive      Thanks for the consult.  Please contact us for any question or concern.  Medicine will follow    Jolee Ewing MD  Internal Medicine    If any questions or concerns, Please contact Med Private Night    Note: For any questions/concerns; please page the Team Pager.        History of Present Illness:     Meghan Carlson is a 69 y.o. female with significant history of coronary artery disease, chronic heart failure with preserved EF, biventricular nonischemic cardiomyopathy, hypothyroidism, pituitary edema is transferred from [facility].  She presented there after a mechanical fall in parking lot, after tripping over an object.  She fell forward and hitting right side of the face on concrete parking lot.  She did not lose consciousness.  At outside hospital, she has CT head, C-spine, maxillofacial were obtained which showed small right-sided subdural hematoma and right orbital fracture.  She is admitted to trauma service in ICU.  She is monitor closely.  She is complaining of headache and wrist pain.  She is alert and following commands      Review of Systems:    Review of Systems   Constitutional: Negative for chills, fever, malaise/fatigue and weight loss.   HENT: Negative for ear pain, hearing loss and tinnitus.    Respiratory: Negative for cough, hemoptysis and sputum production.    Cardiovascular: Negative for orthopnea, claudication, leg swelling and PND.   Genitourinary: Negative for flank pain, frequency, hematuria and urgency.   Musculoskeletal: Positive for back pain, falls, joint pain and neck pain. Negative for myalgias.   Neurological: Positive for weakness. Negative for tingling, tremors, speech change, focal weakness, seizures and loss of consciousness.   Psychiatric/Behavioral: Negative for depression, substance abuse and suicidal ideas. The patient does not have insomnia.        Medical History:   Diagnosis Date   ? Abnormal stress test 12/11/2017   ? Diabetes mellitus (HCC) 11/11/2017   ? Dyslipidemia 11/11/2017   ? Hypothyroid    ? Obesity 11/11/2017   ? OSA (obstructive sleep apnea) 11/11/2017       Surgical History:   Procedure Laterality Date   ? 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 ANGIOGRAPHY CORONARY ARTERY WITH LEFT HEART CATHETERIZATION N/A 12/11/2017    Performed by Greig Castilla, MD at [facility] CATH LAB   ? POSSIBLE PERCUTANEOUS CORONARY STENT PLACEMENT WITH ANGIOPLASTY N/A 12/11/2017    Performed by Greig Castilla, MD at [facility] CATH LAB   ? CESAREAN SECTION, LOW TRANSVERSE     ? KIDNEY STONE SURGERY     ? KNEE REPLACEMENT Right        Family History   Problem Relation Age of Onset   ? Stroke Mother 84   ? Liver Disease Father        Social History     Socioeconomic History   ? Marital status: Married     Spouse name: Not on file   ? Number of children: Not on file   ? Years of education: Not on file   ? Highest education level: Not on file   Occupational History   ? Not on file   Tobacco Use   ? Smoking status: Never Smoker   ? Smokeless tobacco: Never Used   Substance and Sexual Activity   ? Alcohol use: Not Currently   ? Drug use: Never   ? Sexual activity: Not on file   Other Topics Concern   ? Not on file   Social History Narrative    Lives with husband, daughter, son in law, and 2 grandchildren.     Social Determinants of Health     Financial Resource Strain: Not on file   Food Insecurity: Not on file   Transportation Needs: Not on file   Physical Activity: Not on file   Stress: Not on file   Social Connections: Not on file   Intimate Partner Violence: Not on file        All the histories listed above; including Past Medical History, Past Surgical History, Family History and Social History have been reviewed.    Immunizations (includes history and patient reported):   There is no immunization history on file for this patient.        Allergies:  Peanut, Lisinopril, Seasonal allergies, and Sulfonamide [sulfa (sulfonamide antibiotics)]    Medications:  Medications Prior to  Admission   Medication Sig   ? albuterol (VENTOLIN HFA) 90 mcg/actuation inhaler Inhale 2 puffs by mouth into the lungs every 6 hours as needed for Wheezing or Shortness of Breath. Shake well before use.   ? albuterol 0.083% (PROVENTIL; VENTOLIN) 2.5 mg /3 mL (0.083 %) nebulizer solution Inhale 3 mL solution by nebulizer as directed every 4 hours as needed for Wheezing or Shortness of Breath.   ? atorvastatin (LIPITOR) 80 mg tablet TAKE 1 TABLET BY MOUTH EVERY DAY   ? carvediloL (COREG) 6.25 mg tablet TAKE ONE TABLET BY MOUTH TWICE DAILY WITH MEALS. TAKE WITH FOOD.   ? diclofenac sodium (VOLTAREN-XR) 100 mg xr tablet Take 100 mg by mouth daily. Take with food.   ? escitalopram oxalate (LEXAPRO) 20 mg tablet Take 20 mg by mouth daily.   ? fluticasone propionate (FLONASE) 50 mcg/actuation nasal spray Apply  to each nostril as directed as Needed. Shake bottle gently before using.    ? furosemide (LASIX) 20 mg tablet TAKE 2 TABLETS BY MOUTH IN THE MORNING AS NEEDED FOR SWELLING OR WEIGHT GAIN   ? levothyroxine (SYNTHROID) 50 mcg tablet Take 50 mcg by mouth daily 30 minutes before breakfast.   ? losartan (COZAAR) 50 mg tablet Take one tablet by mouth daily.   ? metFORMIN (GLUCOPHAGE) 500 mg tablet TAKE TWO TABLETS BY MOUTH TWICE DAILY WITH MEALS.   ? nitroglycerin (NITROSTAT) 0.4 mg tablet 1 tab under tongue as needed for chest pain, may repeat in 5 min x 2   ? oxycodone(+) (ROXICODONE, OXY-IR) 20 mg tablet Take 20 mg by mouth every 6 hours as needed   ? triamcinolone acetonide (KENALOG) 0.1 % topical cream Apply  topically to affected area as Needed.   ? valACYclovir (VALTREX) 500 mg tablet Take 500 mg by mouth every 24 hours.       Physical Exam:  Vital Signs: Last Filed In 24 Hours Vital Signs: 24 Hour Range   BP: 132/59 (11/26 2200)  Temp: 37 ?C (98.6 ?F) (11/26 1950)  Pulse: 70 (11/26 2200)  Respirations: 24 PER MINUTE (11/26 2200)  SpO2: 95 % (11/26 2200)  SpO2 Pulse: 70 (11/26 2200)  Height: 160 cm (63) (11/26 1935) BP: (132-169)/(59-136)   Temp:  [37 ?C (98.6 ?F)]   Pulse:  [64-85]   Respirations:  [19 PER MINUTE-28 PER MINUTE]   SpO2:  [95 %-98 %]             Physical Exam  Vitals and nursing note reviewed.   HENT:      Head: Normocephalic.      Mouth/Throat:      Mouth: Mucous membranes are moist.   Eyes:      Pupils: Pupils are equal, round, and reactive to light.   Cardiovascular:      Rate and Rhythm: Normal rate and regular rhythm.      Heart sounds: No murmur heard.     Pulmonary:      Effort: Pulmonary effort is normal.   Abdominal:      General: Abdomen is flat. There is no distension.      Tenderness: There is no abdominal tenderness.   Skin:     General: Skin is warm.      Capillary Refill: Capillary refill takes less than 2 seconds.      Coloration: Skin is not jaundiced.      Findings: No bruising.   Neurological:      General: No focal deficit present.  Mental Status: She is alert.      Cranial Nerves: No cranial nerve deficit.      Motor: No weakness.   Psychiatric:         Mood and Affect: Mood normal.           Lab/Radiology/Other Diagnostic Tests:  24-hour labs:    Results for orders placed or performed during the hospital encounter of 03/04/20 (from the past 24 hour(s))   CBC    Collection Time: 03/04/20  7:40 PM   Result Value Ref Range    White Blood Cells 13.7 (H) 4.5 - 11.0 K/UL    RBC 4.30 4.0 - 5.0 M/UL    Hemoglobin 12.8 12.0 - 15.0 GM/DL    Hematocrit 16.1 36 - 45 %    MCV 91.3 80 - 100 FL    MCH 29.8 26 - 34 PG    MCHC 32.7 32.0 - 36.0 G/DL    RDW 09.6 11 - 15 %    Platelet Count 289 150 - 400 K/UL    MPV 9.1 7 - 11 FL   PHOSPHORUS    Collection Time: 03/04/20  7:40 PM   Result Value Ref Range    Phosphorus 4.4 2.0 - 4.5 MG/DL   MAGNESIUM    Collection Time: 03/04/20  7:40 PM   Result Value Ref Range    Magnesium 1.7 1.6 - 2.6 mg/dL   IONIZED CALCIUM    Collection Time: 03/04/20  7:40 PM   Result Value Ref Range    Ionized Calcium 1.16 1.0 - 1.3 MMOL/L   LACTIC ACID(LACTATE)    Collection Time: 03/04/20  7:40 PM   Result Value Ref Range    Lactic Acid 2.7 (H) 0.5 - 2.0 MMOL/L   PROTIME INR (PT)    Collection Time: 03/04/20  7:40 PM   Result Value Ref Range    INR 1.1 0.8 - 1.2   PTT (APTT)    Collection Time: 03/04/20  7:40 PM   Result Value Ref Range    APTT 27.4 24.0 - 36.5 SEC   COMPREHENSIVE METABOLIC PANEL    Collection Time: 03/04/20  7:40 PM   Result Value Ref Range    Sodium 141 137 - 147 MMOL/L    Potassium 4.3 3.5 - 5.1 MMOL/L    Chloride 100 98 - 110 MMOL/L    Glucose 152 (H) 70 - 100 MG/DL    Blood Urea Nitrogen 12 7 - 25 MG/DL    Creatinine 0.45 0.4 - 1.00 MG/DL    Calcium 9.2 8.5 - 40.9 MG/DL    Total Protein 6.9 6.0 - 8.0 G/DL    Total Bilirubin 1.5 (H) 0.3 - 1.2 MG/DL    Albumin 4.2 3.5 - 5.0 G/DL    Alk Phosphatase 69 25 - 110 U/L    AST (SGOT) 23 7 - 40 U/L    CO2 27 21 - 30 MMOL/L    ALT (SGPT) 22 7 - 56 U/L    Anion Gap 14 (H) 3 - 12    eGFR Non African American >60 >60 mL/min    eGFR African American >60 >60 mL/min   POC GLUCOSE    Collection Time: 03/04/20  9:27 PM   Result Value Ref Range    Glucose, POC 134 (H) 70 - 100 MG/DL       Pertinent radiology reviewed.

## 2020-03-06 MED ADMIN — ATORVASTATIN 40 MG PO TAB [77113]: 80 mg | ORAL | @ 15:00:00 | NDC 00904629261

## 2020-03-06 MED ADMIN — LEVOTHYROXINE 75 MCG PO TAB [4422]: 75 ug | ORAL | @ 13:00:00 | NDC 51079044101

## 2020-03-06 MED ADMIN — ESCITALOPRAM OXALATE 20 MG PO TAB [86690]: 20 mg | ORAL | @ 15:00:00 | NDC 00904642761

## 2020-03-07 ENCOUNTER — Encounter: Admit: 2020-03-07 | Discharge: 2020-03-07 | Payer: MEDICARE

## 2020-03-07 MED ADMIN — ATORVASTATIN 40 MG PO TAB [77113]: 80 mg | ORAL | @ 15:00:00 | Stop: 2020-03-07 | NDC 00904629261

## 2020-03-07 MED ADMIN — ESCITALOPRAM OXALATE 20 MG PO TAB [86690]: 20 mg | ORAL | @ 15:00:00 | Stop: 2020-03-07 | NDC 00904642761

## 2020-03-07 MED ADMIN — ENOXAPARIN 30 MG/0.3 ML SC SYRG [85048]: 30 mg | SUBCUTANEOUS | @ 15:00:00 | Stop: 2020-03-07 | NDC 00781323801

## 2020-03-07 MED ADMIN — ENOXAPARIN 30 MG/0.3 ML SC SYRG [85048]: 30 mg | SUBCUTANEOUS | @ 04:00:00 | NDC 00781323801

## 2020-03-07 MED ADMIN — LEVOTHYROXINE 75 MCG PO TAB [4422]: 75 ug | ORAL | @ 12:00:00 | Stop: 2020-03-07 | NDC 51079044101

## 2020-03-07 MED FILL — LEVETIRACETAM 1,000 MG PO TAB: 1000 mg | ORAL | 4 days supply | Qty: 8 | Fill #1 | Status: CP

## 2020-03-07 MED FILL — AMOXICILLIN-POT CLAVULANATE 875-125 MG PO TAB: 875/125 mg | ORAL | 4 days supply | Qty: 8 | Fill #1 | Status: CP

## 2020-03-16 ENCOUNTER — Encounter: Admit: 2020-03-16 | Discharge: 2020-03-16 | Payer: MEDICARE

## 2020-03-23 ENCOUNTER — Encounter: Admit: 2020-03-23 | Discharge: 2020-03-23 | Payer: MEDICARE

## 2020-03-23 ENCOUNTER — Ambulatory Visit: Admit: 2020-03-23 | Discharge: 2020-03-24 | Payer: MEDICARE

## 2020-03-23 DIAGNOSIS — S0285XA Fracture of orbit, unspecified, initial encounter for closed fracture: Secondary | ICD-10-CM

## 2020-03-23 DIAGNOSIS — E785 Hyperlipidemia, unspecified: Secondary | ICD-10-CM

## 2020-03-23 DIAGNOSIS — E669 Obesity, unspecified: Secondary | ICD-10-CM

## 2020-03-23 DIAGNOSIS — E039 Hypothyroidism, unspecified: Secondary | ICD-10-CM

## 2020-03-23 DIAGNOSIS — G4733 Obstructive sleep apnea (adult) (pediatric): Secondary | ICD-10-CM

## 2020-03-23 DIAGNOSIS — R9439 Abnormal result of other cardiovascular function study: Secondary | ICD-10-CM

## 2020-03-23 DIAGNOSIS — E119 Type 2 diabetes mellitus without complications: Secondary | ICD-10-CM

## 2020-03-23 NOTE — Progress Notes
Subjective:       History of Present Illness  Meghan Carlson is a 69 y.o. female.  S/p fall on 11/26 and tripped and fell in the parking lot  Sustained injuries that required evaluation at [facility] ED  - with fractures of the right medial orbital wall and subdural hematoma  - healing well since then  - no vision changes, no blurred or double vision   - wears glasses  - seen by opthalmology and no acute concerns  Hx of HTN, DM, CAD  No smoking  No hx of facial injury  No cancer hx   PCP Dr. Chilton Si in [place]        Outside imaging with minimally displaced orbital wall fracture    Social and family history reviewed and unless mentioned in the HPI are noncontributory    Medical History:   Diagnosis Date   ? Abnormal stress test 12/11/2017   ? Diabetes mellitus (HCC) 11/11/2017   ? Dyslipidemia 11/11/2017   ? Hypothyroid    ? Obesity 11/11/2017   ? OSA (obstructive sleep apnea) 11/11/2017     Surgical History:   Procedure Laterality Date   ? 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 ANGIOGRAPHY CORONARY ARTERY WITH LEFT HEART CATHETERIZATION N/A 12/11/2017    Performed by Greig Castilla, MD at [facility] CATH LAB   ? POSSIBLE PERCUTANEOUS CORONARY STENT PLACEMENT WITH ANGIOPLASTY N/A 12/11/2017    Performed by Greig Castilla, [facility] at [facility] CATH LAB   ? CESAREAN SECTION, LOW TRANSVERSE     ? KIDNEY STONE SURGERY     ? KNEE REPLACEMENT Right      Family History   Problem Relation Age of Onset   ? Stroke Mother 20   ? Liver Disease Father      Social History     Socioeconomic History   ? Marital status: Married     Spouse name: Not on file   ? Number of children: Not on file   ? Years of education: Not on file   ? Highest education level: Not on file   Occupational History   ? Not on file   Tobacco Use   ? Smoking status: Never Smoker   ? Smokeless tobacco: Never Used   Substance and Sexual Activity   ? Alcohol use: Not Currently   ? Drug use: Never   ? Sexual activity: Not on file   Other Topics Concern   ? Not on file   Social History Narrative    Lives with husband, daughter, son in law, and 2 grandchildren.                        Review of Systems   Constitutional: Negative.    HENT: Negative.    Eyes: Negative.    Respiratory: Negative.    Cardiovascular: Negative.    Gastrointestinal: Negative.    Endocrine: Negative.    Genitourinary: Negative.    Musculoskeletal: Negative.    Skin: Negative.    Allergic/Immunologic: Negative.    Neurological: Negative.    Hematological: Negative.    Psychiatric/Behavioral: Negative.          Objective:         ? acetaminophen (TYLENOL) 325 mg tablet Take two tablets by mouth every 4 hours as needed.   ? albuterol (VENTOLIN HFA) 90 mcg/actuation inhaler Inhale 2 puffs by mouth into the lungs every 6 hours as needed for  Wheezing or Shortness of Breath. Shake well before use.   ? albuterol 0.083% (PROVENTIL; VENTOLIN) 2.5 mg /3 mL (0.083 %) nebulizer solution Inhale 3 mL solution by nebulizer as directed every 4 hours as needed for Wheezing or Shortness of Breath.   ? atorvastatin (LIPITOR) 80 mg tablet TAKE 1 TABLET BY MOUTH EVERY DAY   ? carvediloL (COREG) 6.25 mg tablet TAKE ONE TABLET BY MOUTH TWICE DAILY WITH MEALS. TAKE WITH FOOD.   ? diclofenac sodium (VOLTAREN-XR) 100 mg xr tablet Take 100 mg by mouth daily. Take with food.   ? docusate (COLACE) 100 mg capsule Take one capsule by mouth twice daily.   ? escitalopram oxalate (LEXAPRO) 20 mg tablet Take 20 mg by mouth daily.   ? fluticasone propionate (FLONASE) 50 mcg/actuation nasal spray Apply  to each nostril as directed as Needed. Shake bottle gently before using.    ? furosemide (LASIX) 20 mg tablet TAKE 2 TABLETS BY MOUTH IN THE MORNING AS NEEDED FOR SWELLING OR WEIGHT GAIN   ? levothyroxine (SYNTHROID) 50 mcg tablet Take 50 mcg by mouth daily 30 minutes before breakfast.   ? losartan (COZAAR) 50 mg tablet Take one tablet by mouth daily.   ? metFORMIN (GLUCOPHAGE) 500 mg tablet TAKE TWO TABLETS BY MOUTH TWICE DAILY WITH MEALS.   ? naloxone (NARCAN) 4 mg/actuation nasal spray Insert one spray into nose as directed as Needed.   ? nitroglycerin (NITROSTAT) 0.4 mg tablet 1 tab under tongue as needed for chest pain, may repeat in 5 min x 2   ? oxycodone(+) (ROXICODONE, OXY-IR) 20 mg tablet Take 20 mg by mouth every 6 hours as needed   ? polyethylene glycol 3350 (MIRALAX) 17 g packet Take one packet by mouth daily.   ? triamcinolone acetonide (KENALOG) 0.1 % topical cream Apply  topically to affected area as Needed.   ? valACYclovir (VALTREX) 500 mg tablet Take 500 mg by mouth every 24 hours.     Vitals:    03/23/20 1024   BP: (!) 151/66   BP Source: Arm, Right Upper   Patient Position: Sitting   Pulse: 61   Weight: 88 kg (194 lb)   Height: 160 cm (63)   PainSc: Zero     Body mass index is 34.37 kg/m?Marland Kitchen     Physical Exam  Constitutional:       Appearance: Normal appearance.   HENT:      Head: Normocephalic and atraumatic.      Comments: No nasal injury, no dental injury.  Occulusion intact     Nose: Nose normal.      Mouth/Throat:      Mouth: Mucous membranes are moist.   Eyes:      Extraocular Movements: Extraocular movements intact.      Conjunctiva/sclera: Conjunctivae normal.      Pupils: Pupils are equal, round, and reactive to light.      Comments: No diplopia with EOM  No enophthalmos present    Cardiovascular:      Rate and Rhythm: Normal rate.   Pulmonary:      Effort: Pulmonary effort is normal.   Musculoskeletal:         General: Normal range of motion.   Skin:     General: Skin is warm and dry.   Neurological:      General: No focal deficit present.      Mental Status: She is alert and oriented to person, place, and time.   Psychiatric:  Mood and Affect: Mood normal.         Thought Content: Thought content normal.         Judgment: Judgment normal.            will discuss with Dr. Eben Burow  Assessment and Plan:  69 yo with right medial orbital wall fracture minimally displaced  - no acute symptoms of diplopia or vision changes, no surgical indication is indicated  - Return if vision changes  - Follow up PRN

## 2020-04-08 ENCOUNTER — Encounter: Admit: 2020-04-08 | Discharge: 2020-04-08 | Payer: MEDICARE

## 2020-04-08 MED ORDER — ATORVASTATIN 80 MG PO TAB
ORAL_TABLET | Freq: Every day | 3 refills
Start: 2020-04-08 — End: ?

## 2020-04-08 MED ORDER — LOSARTAN 50 MG PO TAB
ORAL_TABLET | Freq: Every day | 3 refills
Start: 2020-04-08 — End: ?

## 2020-04-18 ENCOUNTER — Encounter: Admit: 2020-04-18 | Discharge: 2020-04-18 | Payer: MEDICARE

## 2020-06-02 ENCOUNTER — Encounter: Admit: 2020-06-02 | Discharge: 2020-06-02 | Payer: MEDICARE

## 2020-06-24 ENCOUNTER — Encounter: Admit: 2020-06-24 | Discharge: 2020-06-24 | Payer: MEDICARE

## 2020-07-07 ENCOUNTER — Encounter: Admit: 2020-07-07 | Discharge: 2020-07-07 | Payer: MEDICARE

## 2020-07-14 ENCOUNTER — Encounter: Admit: 2020-07-14 | Discharge: 2020-07-14 | Payer: MEDICARE

## 2020-07-14 DIAGNOSIS — R0600 Dyspnea, unspecified: Secondary | ICD-10-CM

## 2020-07-14 DIAGNOSIS — I5082 Biventricular heart failure: Secondary | ICD-10-CM

## 2020-07-14 DIAGNOSIS — E669 Obesity, unspecified: Secondary | ICD-10-CM

## 2020-07-14 DIAGNOSIS — I2510 Atherosclerotic heart disease of native coronary artery without angina pectoris: Secondary | ICD-10-CM

## 2020-07-14 DIAGNOSIS — E785 Hyperlipidemia, unspecified: Secondary | ICD-10-CM

## 2020-07-14 DIAGNOSIS — R9439 Abnormal result of other cardiovascular function study: Secondary | ICD-10-CM

## 2020-07-14 DIAGNOSIS — E119 Type 2 diabetes mellitus without complications: Secondary | ICD-10-CM

## 2020-07-14 DIAGNOSIS — I5032 Chronic diastolic (congestive) heart failure: Secondary | ICD-10-CM

## 2020-07-14 DIAGNOSIS — E039 Hypothyroidism, unspecified: Secondary | ICD-10-CM

## 2020-07-14 DIAGNOSIS — I10 Essential (primary) hypertension: Secondary | ICD-10-CM

## 2020-07-14 DIAGNOSIS — G4733 Obstructive sleep apnea (adult) (pediatric): Secondary | ICD-10-CM

## 2020-07-14 DIAGNOSIS — E1169 Type 2 diabetes mellitus with other specified complication: Secondary | ICD-10-CM

## 2020-07-14 MED ORDER — CARVEDILOL 12.5 MG PO TAB
12.5 mg | ORAL_TABLET | Freq: Two times a day (BID) | ORAL | 3 refills | 90.00000 days | Status: AC
Start: 2020-07-14 — End: ?

## 2020-07-14 MED ORDER — ASPIRIN 81 MG PO CHEW
81 mg | ORAL_TABLET | Freq: Every day | ORAL | 1 refills | Status: AC
Start: 2020-07-14 — End: ?

## 2020-07-14 MED ORDER — NITROGLYCERIN 0.4 MG SL SUBL
ORAL_TABLET | SUBLINGUAL | 3 refills | 9.00000 days | Status: AC | PRN
Start: 2020-07-14 — End: ?

## 2020-07-21 ENCOUNTER — Encounter: Admit: 2020-07-21 | Discharge: 2020-07-21 | Payer: MEDICARE

## 2020-07-21 MED ORDER — ATORVASTATIN 80 MG PO TAB
ORAL_TABLET | Freq: Every day | 1 refills | Status: AC
Start: 2020-07-21 — End: ?

## 2020-08-03 ENCOUNTER — Encounter: Admit: 2020-08-03 | Discharge: 2020-08-03 | Payer: MEDICARE

## 2020-08-04 ENCOUNTER — Encounter: Admit: 2020-08-04 | Discharge: 2020-08-04 | Payer: MEDICARE

## 2020-08-04 DIAGNOSIS — R0600 Dyspnea, unspecified: Secondary | ICD-10-CM

## 2020-08-04 DIAGNOSIS — I2510 Atherosclerotic heart disease of native coronary artery without angina pectoris: Secondary | ICD-10-CM

## 2020-08-04 DIAGNOSIS — E1169 Type 2 diabetes mellitus with other specified complication: Secondary | ICD-10-CM

## 2020-08-04 DIAGNOSIS — I5082 Biventricular heart failure: Secondary | ICD-10-CM

## 2020-08-04 DIAGNOSIS — I10 Essential (primary) hypertension: Secondary | ICD-10-CM

## 2020-08-04 DIAGNOSIS — I5032 Chronic diastolic (congestive) heart failure: Secondary | ICD-10-CM

## 2020-08-04 LAB — BASIC METABOLIC PANEL
ANION GAP: 17 — ABNORMAL HIGH (ref 0–14)
BLD UREA NITROGEN: 11
CALCIUM: 8.8
CHLORIDE: 101
CO2: 27
CREATININE: 0.7
GFR ESTIMATED: 76
GLUCOSE,PANEL: 211 — ABNORMAL HIGH (ref 70–105)
POTASSIUM: 3.6
SODIUM: 141

## 2020-08-04 LAB — LIPID PROFILE
CHOLESTEROL: 105 M/UL — ABNORMAL LOW (ref 4.0–5.0)
HDL: 29 % — ABNORMAL LOW (ref >40)
LDL: 44 FL — ABNORMAL HIGH (ref 80–100)
VLDL: 32 pg — ABNORMAL HIGH (ref 26–34)

## 2020-08-04 LAB — BNP (B-TYPE NATRIURETIC PEPTI): BNP: 48 % — ABNORMAL HIGH (ref 11–15)

## 2020-08-04 LAB — MAGNESIUM: MAGNESIUM: 1.9 U/L (ref 7–56)

## 2020-08-05 ENCOUNTER — Encounter: Admit: 2020-08-05 | Discharge: 2020-08-05 | Payer: MEDICARE

## 2020-08-05 DIAGNOSIS — E119 Type 2 diabetes mellitus without complications: Secondary | ICD-10-CM

## 2020-08-05 DIAGNOSIS — E038 Other specified hypothyroidism: Secondary | ICD-10-CM

## 2020-08-05 NOTE — Telephone Encounter
Called and discussed results with patient.  No questions at this time. Discussed high potassium foods with pt.  She verbalized understanding. Pt will callback with any questions, concerns or problems.

## 2020-08-05 NOTE — Telephone Encounter
-----   Message from Jamison Neighbor, MD sent at 08/04/2020  6:23 PM CDT -----  NO changes except please provide a lisst of high potassium foods and encourage her to eat three high potassium foods daily.    JST  ----- Message -----  From: Claretta Fraise, RN  Sent: 08/04/2020   1:26 PM CDT  To: Jamison Neighbor, MD    Labs for your review and recommendations. Patient states that she is doing well and denies any new symptoms. She states that her pressures have been stable around 130s/60s-70s. HR in the 60s. She states that her weight has remained stable at 190lbs. Thank you!

## 2020-08-08 ENCOUNTER — Encounter: Admit: 2020-08-08 | Discharge: 2020-08-08 | Payer: MEDICARE

## 2020-08-08 DIAGNOSIS — E119 Type 2 diabetes mellitus without complications: Secondary | ICD-10-CM

## 2020-08-08 DIAGNOSIS — E559 Vitamin D deficiency, unspecified: Secondary | ICD-10-CM

## 2020-08-08 DIAGNOSIS — M8589 Other specified disorders of bone density and structure, multiple sites: Secondary | ICD-10-CM

## 2020-08-09 ENCOUNTER — Encounter: Admit: 2020-08-09 | Discharge: 2020-08-09 | Payer: MEDICARE

## 2020-08-09 NOTE — Telephone Encounter
08/04/2020 00:00   T4-Free 1.69 (A)   TSH 1.28        08/04/2020 00:00   Vitamin D(25-OH)Total 31        08/04/2020 00:00   CO2 27.0   Anion Gap 17 (H)   Blood Urea Nitrogen 11.0   Creatinine 0.79   eGFR Non African American 76.7   eGFR African American *   Glucose 211 (H)   Calcium 8.8   Magnesium 1.9        08/04/2020 00:00   Sodium, 24 HR 66   Sodium, Random 66   Creatinine, Random 52.64   Microalbumin, Random <5.0   Calcium 24 Hr 341.3 (A)   Creatinine, Ur 31.05 (A)   Urine Volume 3,750   Creatinine, 24 HR Urine 1.2   Microalbumin/CR ratio Urine Ubaldo Glassing,   She has appointment with you tomorrow.   Future Appointments   Date Time Provider [place]   08/10/2020  2:00 PM Rolm Bookbinder [facility] IM   08/16/2020  1:00 PM AT[facility] NURSE [facility] CVM Exam   08/17/2020  9:00 AM [facility] CVM Procedur       She high 24 hr urine calcium  Which could be due to lasix too. Please ask her about dietary and oral calcium supplementation. If she is taking oral calcium tab then may be stop /decrease po calcium tablets and repeat 24 hr urine calcium in 3 month.    If she is not on oral calcium tablets, may be if it is possible hold lasix for 2 days and get 24 hr urine calcium again in 3 months    Please let me know with any questions.    Thank you

## 2020-08-10 ENCOUNTER — Encounter: Admit: 2020-08-10 | Discharge: 2020-08-10 | Payer: MEDICARE

## 2020-08-10 ENCOUNTER — Ambulatory Visit: Admit: 2020-08-10 | Discharge: 2020-08-11 | Payer: MEDICARE

## 2020-08-10 DIAGNOSIS — R82994 Hypercalciuria: Secondary | ICD-10-CM

## 2020-08-10 DIAGNOSIS — E039 Hypothyroidism, unspecified: Secondary | ICD-10-CM

## 2020-08-10 DIAGNOSIS — E669 Obesity, unspecified: Secondary | ICD-10-CM

## 2020-08-10 DIAGNOSIS — E6609 Other obesity due to excess calories: Secondary | ICD-10-CM

## 2020-08-10 DIAGNOSIS — E119 Type 2 diabetes mellitus without complications: Secondary | ICD-10-CM

## 2020-08-10 DIAGNOSIS — E038 Other specified hypothyroidism: Secondary | ICD-10-CM

## 2020-08-10 DIAGNOSIS — R9439 Abnormal result of other cardiovascular function study: Secondary | ICD-10-CM

## 2020-08-10 DIAGNOSIS — G4733 Obstructive sleep apnea (adult) (pediatric): Secondary | ICD-10-CM

## 2020-08-10 DIAGNOSIS — E785 Hyperlipidemia, unspecified: Secondary | ICD-10-CM

## 2020-08-10 MED ORDER — LEVOTHYROXINE 50 MCG PO TAB
50 ug | ORAL_TABLET | Freq: Every day | ORAL | 0 refills | 30.00000 days | Status: AC
Start: 2020-08-10 — End: ?

## 2020-08-10 MED ORDER — OZEMPIC 0.25 MG OR 0.5 MG(2 MG/1.5 ML) SC PNIJ
.25 mg | SUBCUTANEOUS | 0 refills | Status: AC
Start: 2020-08-10 — End: ?

## 2020-08-10 NOTE — Telephone Encounter
Urine volume is high, Ca/Cr ratio is not that high. She should continue with calcium intake via diet 3-4 servings per day but no calcium tablets. Limit sodium intake as much as possible.     We should not hold lasix without checking with cardiology as she takes it for heart failure. I think we can monitor for now unless she is actively having kidney stones.

## 2020-08-11 ENCOUNTER — Encounter: Admit: 2020-08-11 | Discharge: 2020-08-11 | Payer: MEDICARE

## 2020-08-11 DIAGNOSIS — E785 Hyperlipidemia, unspecified: Secondary | ICD-10-CM

## 2020-08-11 DIAGNOSIS — E119 Type 2 diabetes mellitus without complications: Principal | ICD-10-CM

## 2020-08-11 DIAGNOSIS — E669 Obesity, unspecified: Secondary | ICD-10-CM

## 2020-08-11 DIAGNOSIS — E063 Autoimmune thyroiditis: Secondary | ICD-10-CM

## 2020-08-11 DIAGNOSIS — E039 Hypothyroidism, unspecified: Secondary | ICD-10-CM

## 2020-08-11 DIAGNOSIS — R9439 Abnormal result of other cardiovascular function study: Secondary | ICD-10-CM

## 2020-08-11 DIAGNOSIS — G4733 Obstructive sleep apnea (adult) (pediatric): Secondary | ICD-10-CM

## 2020-08-16 ENCOUNTER — Encounter: Admit: 2020-08-16 | Discharge: 2020-08-16 | Payer: MEDICARE

## 2020-08-16 NOTE — Progress Notes
Patient's last office visit with JST was 07/14/20. Her blood pressure during that visit was 178/81. JST increased coreg 6.25 twice daily to 12.5 twice daily. Patient did not bring log of blood pressures or weights but states her weight has been stable, she is not having any new or increase of symptoms and her blood pressure at home over the last few days has been 130's/60's. Patient encouraged to keep and bring log. Today in the office her blood pressure was 140/62 HR 64.     Will send to Dr. Adah Perl for her review and recommendations.

## 2020-08-17 ENCOUNTER — Encounter: Admit: 2020-08-17 | Discharge: 2020-08-17 | Payer: MEDICARE

## 2020-08-17 ENCOUNTER — Ambulatory Visit: Admit: 2020-08-17 | Discharge: 2020-08-17 | Payer: MEDICARE

## 2020-08-17 DIAGNOSIS — I5032 Chronic diastolic (congestive) heart failure: Secondary | ICD-10-CM

## 2020-08-17 DIAGNOSIS — E1169 Type 2 diabetes mellitus with other specified complication: Secondary | ICD-10-CM

## 2020-08-17 DIAGNOSIS — I10 Essential (primary) hypertension: Secondary | ICD-10-CM

## 2020-08-17 DIAGNOSIS — I2510 Atherosclerotic heart disease of native coronary artery without angina pectoris: Secondary | ICD-10-CM

## 2020-08-17 DIAGNOSIS — I5082 Biventricular heart failure: Secondary | ICD-10-CM

## 2020-08-17 DIAGNOSIS — R0600 Dyspnea, unspecified: Secondary | ICD-10-CM

## 2020-08-17 MED ORDER — PERFLUTREN LIPID MICROSPHERES 1.1 MG/ML IV SUSP
1-10 mL | Freq: Once | INTRAVENOUS | 0 refills | Status: CP | PRN
Start: 2020-08-17 — End: ?

## 2020-08-17 NOTE — Progress Notes
The echo results have been released to MyChart; there is no change in treatment plan.

## 2020-09-08 ENCOUNTER — Encounter: Admit: 2020-09-08 | Discharge: 2020-09-08 | Payer: MEDICARE

## 2020-09-08 DIAGNOSIS — E039 Hypothyroidism, unspecified: Secondary | ICD-10-CM

## 2020-09-08 DIAGNOSIS — E119 Type 2 diabetes mellitus without complications: Secondary | ICD-10-CM

## 2020-09-08 DIAGNOSIS — G4733 Obstructive sleep apnea (adult) (pediatric): Secondary | ICD-10-CM

## 2020-09-08 DIAGNOSIS — E785 Hyperlipidemia, unspecified: Secondary | ICD-10-CM

## 2020-09-08 DIAGNOSIS — E669 Obesity, unspecified: Secondary | ICD-10-CM

## 2020-09-08 DIAGNOSIS — R9439 Abnormal result of other cardiovascular function study: Secondary | ICD-10-CM

## 2020-09-22 ENCOUNTER — Encounter: Admit: 2020-09-22 | Discharge: 2020-09-22 | Payer: MEDICARE

## 2020-09-22 MED ORDER — LOSARTAN 50 MG PO TAB
ORAL_TABLET | Freq: Every day | ORAL | 3 refills | 30.00000 days | Status: AC
Start: 2020-09-22 — End: ?

## 2020-09-23 ENCOUNTER — Encounter: Admit: 2020-09-23 | Discharge: 2020-09-23 | Payer: MEDICARE

## 2020-09-23 DIAGNOSIS — E038 Other specified hypothyroidism: Secondary | ICD-10-CM

## 2020-09-23 LAB — FREE T4 (FREE THYROXINE) ONLY: FREE T4: 1 ng/dL (ref 0.70–1.48)

## 2020-09-23 LAB — THYROID STIMULATING HORMONE-TSH: TSH: 3.4 u[IU]/mL (ref 0.35–4.94)

## 2020-09-23 MED ORDER — METFORMIN 500 MG PO TB24
ORAL_TABLET | Freq: Every day | 1 refills | Status: AC
Start: 2020-09-23 — End: ?

## 2020-09-23 MED ORDER — LEVOTHYROXINE 50 MCG PO TAB
50 ug | ORAL_TABLET | Freq: Every day | ORAL | 0 refills
Start: 2020-09-23 — End: ?

## 2020-10-17 ENCOUNTER — Encounter: Admit: 2020-10-17 | Discharge: 2020-10-17 | Payer: MEDICARE

## 2020-10-17 DIAGNOSIS — E038 Other specified hypothyroidism: Secondary | ICD-10-CM

## 2020-10-17 MED ORDER — LEVOTHYROXINE 50 MCG PO TAB
50 ug | ORAL_TABLET | Freq: Every day | ORAL | 2 refills | 30.00000 days | Status: AC
Start: 2020-10-17 — End: ?

## 2020-10-17 NOTE — Telephone Encounter
LT4 refill request from pharmacy  LOV 08/10/2020; NOV 11/22/2020  LT4 58mcg daily  Refill sent to pharmacy per protocol

## 2020-11-15 ENCOUNTER — Encounter: Admit: 2020-11-15 | Discharge: 2020-11-15 | Payer: MEDICARE

## 2020-11-22 ENCOUNTER — Encounter: Admit: 2020-11-22 | Discharge: 2020-11-22 | Payer: MEDICARE

## 2020-11-22 ENCOUNTER — Ambulatory Visit: Admit: 2020-11-22 | Discharge: 2020-11-22 | Payer: MEDICARE

## 2020-11-22 DIAGNOSIS — E119 Type 2 diabetes mellitus without complications: Secondary | ICD-10-CM

## 2020-11-22 DIAGNOSIS — E038 Other specified hypothyroidism: Secondary | ICD-10-CM

## 2020-11-22 DIAGNOSIS — E785 Hyperlipidemia, unspecified: Secondary | ICD-10-CM

## 2020-11-22 DIAGNOSIS — R9439 Abnormal result of other cardiovascular function study: Secondary | ICD-10-CM

## 2020-11-22 DIAGNOSIS — E669 Obesity, unspecified: Secondary | ICD-10-CM

## 2020-11-22 DIAGNOSIS — G4733 Obstructive sleep apnea (adult) (pediatric): Secondary | ICD-10-CM

## 2020-11-22 DIAGNOSIS — E039 Hypothyroidism, unspecified: Secondary | ICD-10-CM

## 2020-11-22 MED ORDER — GLIPIZIDE 5 MG PO TAB
2.5 mg | ORAL_TABLET | Freq: Every day | ORAL | 3 refills | Status: AC
Start: 2020-11-22 — End: ?

## 2020-11-22 NOTE — Progress Notes
Date of Service: 11/22/2020    Subjective:             Meghan Carlson is a 70 y.o. female is here for DM and hypothyroidism follow up.    History of Present Illness  Meghan Carlson is a 70 y.o. female with past medical history of diabetes mellitus, hypothyroidism, pituitary gland surgery, diastolic heart failure is?here for follow up.  ?  Interval history  She has no active issues. Currently taking levothyroxine 50 MCG daily which was changed in May 2022 based on elevated FT4 levels.    ?  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 Was 7.9 previously in May 2022.    Current regimen:?Metformin 1G BID   She consumes two meals a day: lunch 11-12 and dinner 7-8 pm  POC checks: morning usually, 170-200, drink ice tea, pop corn occasionally before going to bed  ?  Complication:  Retinopathy/Vision: ?eye exam on 11/23/20   Peripheral neuropathy/Feet:?no  Autonomic neuropathy:?NO  Nephropathy:?No  Macrovascular complications:?No  ?  No?History of pancreatitis.???No history of thyroid cancer in family.  She tried Invokana??in past and was stopped due to?recurrent UTI.  ?  Meds:  On ACEi/ARB:?Losartan  On Statin:?Lipitor  ?  Dyslipidemia  ? On atorvastatin 80 mg daily  ?  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 Dx- 2010  ? PTA-levothyroxine?50?mcg daily, taking appropriately   Latest Reference Range & Units 09/21/20 00:00   T4-Free 0.70 - 1.48 ng/dL 1.61   TSH 0.96 - 0.45 uIU/mL 3.47     ?  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 Post surgery not on any hormonal replacement.  ? Hormonal workup done on 12/2019?did not show cortisol or ACTH deficiency.  ?  ?  Hypercalciuria  ? H/O  2 episodes of kidney stone.  last in 2020 per patient.  ? Right wrist fracture with mild trauma in 1990?after trauma  ? Was on IM steroid injection for 2 years.  ? No family history of osteoporosis or osteopenia or parathyroid disease  She is not taking  Calcium tablets  Taking 1-2 serving of dairy products  ? 08/04/2020 00:00   Sodium, 24 HR 66   Sodium, Random 66   Creatinine, Random 52.64   Microalbumin, Random <5.0   Calcium 24 Hr 341.3 (A)   Creatinine, Ur 31.05 (A)   Urine Volume 3,750   Creatinine, 24 HR Urine 1.2   Microalbumin/CR ratio Urine *   ?  ?  ?  Vitamin D deficiency  ? 12/24/2019  08/04/2020   Vitamin D(25-OH)Total 19 (A) 31   Takes Vitamin D daily, doesn't know the dose   ?  Osteopenia  ? Bone density done on 04/2020 showed osteopenia with low FRAX score?(although very close to osteoporosis).  ?       Review of Systems   Constitutional: Negative for fatigue and fever.   HENT: Negative for sore throat.    Eyes: Negative for visual disturbance.   Respiratory: Negative for shortness of breath and wheezing.    Cardiovascular: Negative for chest pain, palpitations and leg swelling.   Endocrine: Negative for cold intolerance and heat intolerance.   Skin: Negative for wound.   Neurological: Negative for dizziness and light-headedness.   Psychiatric/Behavioral: Negative for behavioral problems.         Objective:         ? albuterol sulfate (PROAIR HFA) 90 mcg/actuation HFA aerosol  inhaler Inhale 2 puffs by mouth into the lungs every 6 hours as needed for Wheezing or Shortness of Breath. Shake well before use.   ? aspirin (ASPIRIN CHILDRENS) 81 mg chewable tablet Chew one tablet by mouth daily. Take with food.   ? atorvastatin (LIPITOR) 80 mg tablet TAKE 1 TABLET BY MOUTH EVERY DAY   ? carvediloL (COREG) 12.5 mg tablet Take one tablet by mouth twice daily with meals. Take with food.   ? diclofenac sodium (VOLTAREN-XR) 100 mg xr tablet Take 100 mg by mouth daily. Take with food.   ? escitalopram oxalate (LEXAPRO) 20 mg tablet Take 20 mg by mouth daily.   ? fluticasone propionate (FLONASE) 50 mcg/actuation nasal spray Apply  to each nostril as directed as Needed. Shake bottle gently before using.    ? furosemide (LASIX) 20 mg tablet TAKE 2 TABLETS BY MOUTH IN THE MORNING AS NEEDED FOR SWELLING OR WEIGHT GAIN   ? glipiZIDE (GLUCOTROL) 5 mg tablet Take one-half tablet by mouth daily with lunch. Indications: type 2 diabetes mellitus   ? levothyroxine (SYNTHROID) 50 mcg tablet TAKE ONE TABLET BY MOUTH DAILY 30 MINUTES BEFORE BREAKFAST.   ? losartan (COZAAR) 50 mg tablet TAKE 1 TABLET BY MOUTH EVERY DAY   ? metFORMIN-XR (GLUCOPHAGE XR) 500 mg extended release tablet TAKE 4 TABLETS BY MOUTH DAILY WITH DINNER.   ? nitroglycerin (NITROSTAT) 0.4 mg tablet Place 1 tab under tongue every 5 minutes as needed for Chest Pain (Not to exceed 3 doses /15 min. If pain persists , seek medical attention   ? oxycodone(+) (ROXICODONE, OXY-IR) 20 mg tablet Take 20 mg by mouth every 6 hours as needed   ? semaglutide (OZEMPIC) 0.25 mg or 0.5 mg(2 mg/1.5 mL) injection PEN Inject one-quarter mg under the skin every 7 days.   ? triamcinolone acetonide (KENALOG) 0.1 % topical cream Apply  topically to affected area as Needed.   ? valACYclovir (VALTREX) 500 mg tablet Take 500 mg by mouth every 24 hours.     Vitals:    11/22/20 1257   BP: (!) 167/68   BP Source: Arm, Left Upper   Pulse: 52   PainSc: Zero   Weight: 86.5 kg (190 lb 12.8 oz)   Height: 160 cm (5' 3)     Body mass index is 33.8 kg/m?Marland Kitchen     Physical Exam  Vitals and nursing note reviewed.   Constitutional:       General: She is not in acute distress.     Appearance: Normal appearance. She is obese.   HENT:      Mouth/Throat:      Mouth: Mucous membranes are moist.      Pharynx: Oropharynx is clear.   Eyes:      Conjunctiva/sclera: Conjunctivae normal.   Cardiovascular:      Rate and Rhythm: Normal rate and regular rhythm.      Pulses: Normal pulses.      Heart sounds: Normal heart sounds.   Pulmonary:      Effort: Pulmonary effort is normal.      Breath sounds: Normal breath sounds.   Musculoskeletal:      Cervical back: Normal range of motion.      Right lower leg: No edema.      Left lower leg: No edema.   Skin:     General: Skin is warm. Coloration: Skin is not jaundiced.   Neurological:      Mental Status: She is alert.   Psychiatric:  Mood and Affect: Mood normal.         Behavior: Behavior normal.         Thought Content: Thought content normal.          Assessment and Plan:  Diabetes mellitus type 2  ? Currently on metformin 1000 mg BID  A1c 7.9 %, on higher limit for her age range and would like to make some improvement.  Ozempic was cost prohibitive. SGLTi discontinued due to UTI's.  Thus will start low dose glipizide 2.5 mg daily with lunch or breakfast. Patient warned to avoid this medicine if NPO due to hypoglycemia risk  Patient will inform us if cost is not an issue for her for future Ozempic use as she says her doctor is looking into getting her some discount program  Dietician referral placed as emphasized on proper dietary choices.  ?  Diabetes mellitus annual monitoring  Urine microalbumin:? No microalbuminuria on 08/04/2020  Lipid panel:?? 08/04/2020 shows LDL within goal  Blood chemistry:?last done on 07/2020 with normal creatinine  Eye examination:?recommended to make new appointment  Foot examination:?done on 04/18/2020  ?  ?  Hypothyroidism  ? Currently on levothyroxine?50 mcg daily.  ? Last TFT's in range  ?  Osteopenia  ? Bone density done on 04/2020 showed osteopenia with low FRAX score?(although very close to osteoporosis).  ? Repeat Bone density in 2 years or earlier if clinically indicated  ? Continue with vitamin D and dietary calcium supplementation  ?  Vitamin D deficiency  -Recent vitamin D is normal.  Continue with vitamin D supplementation  ?  Hypercalciuria  ? 24-hour urine calcium on 07/27/2020 showed hypercalciuria.  This was done while she was on Lasix.  Last episode of kidney stone was in 2020 per patient report  ? Recommend to have 2-3 servings of dietary calcium and not to take any calcium tablets or Tums orally  ? Advised to contact us if she develops any new kidney stones    Breyden Jeudy  Endocrine Fellow, PGY-4    Patient seen and discussed with Dr. Sherral Hammers

## 2020-11-28 ENCOUNTER — Ambulatory Visit: Admit: 2020-11-28 | Discharge: 2020-11-29 | Payer: MEDICARE

## 2020-11-28 ENCOUNTER — Encounter: Admit: 2020-11-28 | Discharge: 2020-11-28 | Payer: MEDICARE

## 2020-11-28 DIAGNOSIS — E119 Type 2 diabetes mellitus without complications: Principal | ICD-10-CM

## 2020-11-28 NOTE — Patient Instructions
It was a pleasure seeing you today, Meghan Carlson.    Below are topics we discussed during our visit:    We reviewed blood sugar targets:   Before meals/fasting: 80-130 mg/dl  2 hours after eating: less than 180 mg/dl  Low blood sugar: less than 70 mg/dl    *If blood sugar is less than 70 mg/dl, treat with 15 grams of quick-acting sugar (1/2 cup juice, 3 glucose tabs, 15 skittles, 1 package of fruit snacks, box of mini raisins, etc)    Wait 15 minutes, recheck your blood glucose level. If your blood glucose is still low, consume another 15 grams of carbohydrate and recheck 15 minutes later.     Since blood glucose levels may begin to drop again about 40-60 minutes after treatment, it is a good idea to recheck your blood glucose ~ 1 hour after treating a low. Once your blood glucose is in a normal range, eat a combination snack of carbohydrate and protein (apple and peanut butter or cheese and crackers).      We reviewed some basics of blood sugar control. All foods can be broken down into 3 categories: protein, fat or carbohydrates. We reviewed food sources of eat nutrient. We discussed how carbohydrate containing foods are foods that influence blood sugar the most. It is important to get enough protein and fat at meals to keep you full and to keep your blood sugars stable. You don't want eliminate carbohydrates completely, but choose a fiber rich (less processed source) which is broken down slowly/slows digestion.      We discussed working on eating 30 grams     Please do not hesitate to call or send a MyChart message with any questions or concerns.    Take care,    Ebony Hail, MS, RD, LD  Registered Dietitian  [facility]    For appointments: [phone]      Endo on call: [phone] after hours

## 2021-01-09 ENCOUNTER — Encounter: Admit: 2021-01-09 | Discharge: 2021-01-09 | Payer: MEDICARE

## 2021-02-22 ENCOUNTER — Encounter: Admit: 2021-02-22 | Discharge: 2021-02-22 | Payer: MEDICARE

## 2021-02-23 ENCOUNTER — Encounter: Admit: 2021-02-23 | Discharge: 2021-02-23 | Payer: MEDICARE

## 2021-02-23 NOTE — Progress Notes
Records Request    Chenise Mulvihill DOB: [birth year]    STAT    Medical records request for continuation of care:    Patient has appointment on TODAY  with  Dr. Brett Albino .    Please fax records to [facility] [place]    Request records:    Recent Labs    Thank you,      Cardiovascular Medicine  [facility] [place]  [address] [place] [address]  Phone:  [phone]  Fax:  [phone]

## 2021-03-11 NOTE — Progress Notes
Date of Service: 03/13/2021      Patient: Meghan Carlson  VWU:[id number]  DOB: [birth year]  Her PCP is Burnadette Pop.      Problem List:  Patient Active Problem List    Diagnosis Date Noted   ? Lactic acidosis 03/07/2020   ? Subdural hematoma 03/04/2020   ? Coronary artery disease involving native coronary artery of native heart without angina pectoris 08/25/2018   ? Carotid artery calcification 03/18/2018   ? Abnormal stress test 12/11/2017   ? DOE (dyspnea on exertion) 11/28/2017   ? Hypothyroidism 11/11/2017   ? Dyslipidemia 11/11/2017   ? Essential hypertension 11/11/2017   ? Obesity 11/11/2017   ? Diabetes mellitus (HCC) 11/11/2017   ? OSA (obstructive sleep apnea) 11/11/2017         Subjective:  Meghan Carlson is a 70 y.o. female presents to the advanced heart failure clinic for Follow-up visit.    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     Patient previously managed by Dr. Pierre Bali. Patient last seen in HF clinic on 07/14/2020. At this visit, patient was doing overall well and reported stable symptoms of dyspnea on exertion while going up hills or stairs.  And the following recommendations were made:   -Increase Coreg to 12.5 mg twice daily  -Keep track of blood pressures, weights and heart rates   -Labs in 1 month including BNP, BMP, mag, lipids  -TTE in May 2022   ? The left ventricular size is normal. The left ventricular wall thickness is normal. Normal geometry. The left ventricular systolic function is normal. The ejection fraction by Simpson's biplane method is 66%. There are no segmental wall motion abnormalities. Normal left ventricular diastolic function.  ? The right ventricular size, wall thickness and systolic function are normal. The pulmonary artery pressure could not be estimated due to inadequate tricuspid regurgitation signal.  ? Structure and function of the valves is normal.  ? The aortic root and ascending aorta are normal in size.  ? No pericardial effuion.  ? Compared to study 03/25/2019, there is no significant change.  -RN visit in 1 month  -MD visit in 9 to 12 months    She currently complains of DOE when walking up hills or stairs, which is stable for her.  She denies chest pain, chest pressure/discomfort, palpitations, irregular heart beats, near-syncope, syncope, fatigue, orthopnea, paroxysmal nocturnal dyspnea, lower extremity edema.     She takes furosemide 20 mg about once every couple weeks when she feels fluid retention in her belly or notable lower extremity swelling.  In the past 6 months she has rarely taken furosemide more than twice a week.    Current diet: well balanced  She is adherent to low salt diet.  Current exercise: no regular exercise  She states she is compliant with medications.   Home BP readings are around 120/60s, rarely 130s over 70s  Home weight has been stable around 188 -191 pounds.  Patient was prescribed Ozempic in the past, but this medication was cost prohibitive.    Medications:  Current Outpatient Medications   Medication Instructions   ? albuterol sulfate (PROAIR HFA) 90 mcg/actuation HFA aerosol inhaler 2 puffs, Inhalation, EVERY  6 HOURS PRN, Shake well before use.    ? aspirin (ASPIRIN CHILDRENS) 81 mg, Oral, DAILY, Take with food.   ?  atorvastatin (LIPITOR) 80 mg tablet TAKE 1 TABLET BY MOUTH EVERY DAY   ? carvediloL (COREG) 12.5 mg, Oral, TWICE DAILY WITH MEALS, Take with food.   ? diclofenac sodium (VOLTAREN-XR) 100 mg, Oral, DAILY, Take with food.    ? escitalopram oxalate (LEXAPRO) 20 mg, Oral, DAILY   ? fluticasone propionate (FLONASE) 50 mcg/actuation nasal spray Each Nostril, AS NEEDED, Shake bottle gently before using.   ? furosemide (LASIX) 20 mg tablet TAKE 2 TABLETS BY MOUTH IN THE MORNING AS NEEDED FOR SWELLING OR WEIGHT GAIN   ? glipiZIDE (GLUCOTROL) 2.5 mg, Oral, DAILY WITH LUNCH   ? levothyroxine (SYNTHROID) 50 mcg, Oral, DAILY BEFORE BREAKFAST   ? losartan (COZAAR) 50 mg tablet TAKE 1 TABLET BY MOUTH EVERY DAY   ? metFORMIN-XR (GLUCOPHAGE XR) 500 mg extended release tablet TAKE 4 TABLETS BY MOUTH DAILY WITH DINNER.   ? nitroglycerin (NITROSTAT) 0.4 mg tablet Place 1 tab under tongue every 5 minutes as needed for Chest Pain (Not to exceed 3 doses /15 min. If pain persists , seek medical attention   ? oxyCODONE 20 mg, Oral, EVERY  6 HOURS PRN   ? OZEMPIC 0.25 mg, Subcutaneous, EVERY  7 DAYS   ? triamcinolone acetonide (KENALOG) 0.1 % topical cream Topical, AS NEEDED   ? valACYclovir (VALTREX) 500 mg, Oral, EVERY 24 HOURS          Allergies:   Allergies   Allergen Reactions   ? Peanut SWOLLEN TONGUE   ? Lisinopril COUGH   ? Seasonal Allergies RHINITIS   ? Sulfonamide [Sulfa (Sulfonamide Antibiotics)] ITCHING         Objective:  BP (!) 140/72 (BP Source: Arm, Left Upper, Patient Position: Sitting)  - Pulse 55  - Ht 160 cm (5' 3)  - Wt 85.3 kg (188 lb)  - SpO2 96%  - BMI 33.30 kg/m?   BP Readings from Last 3 Encounters:   03/13/21 (!) 140/72   11/22/20 (!) 167/68   08/17/20 (!) 146/72      Pulse Readings from Last 3 Encounters:   03/13/21 55   11/22/20 52   08/16/20 64     Wt Readings from Last 8 Encounters:   03/13/21 85.3 kg (188 lb)   11/22/20 86.5 kg (190 lb 12.8 oz)   08/17/20 87.7 kg (193 lb 6.4 oz)   08/10/20 88.6 kg (195 lb 6.4 oz)   07/14/20 87.6 kg (193 lb 3.2 oz)   04/18/20 86.8 kg (191 lb 6.4 oz)   03/23/20 88 kg (194 lb)   03/06/20 88.3 kg (194 lb 10.7 oz)       Constitutional: She appears well-developed and well-nourished.   HENT:  Head: Normocephalic.    Mouth/Throat: Oropharynx is clear and moist.   Eyes: Conjunctivae are normal.   Neck: Normal range of motion. CVP of 7. Normal carotid pulses.  Cardiovascular: Normal rate, regular rhythm, normal heart sounds and intact distal pulses. no Murmur. Exam reveals no gallop and no friction rub.  Pulmonary/Chest: Effort normal and breath sounds normal. No respiratory distress. She has no wheezes or rales. She exhibits no chest wall tenderness.   Abdominal: Soft. Bowel sounds are normal. She exhibits no distension. There is no tenderness.   Musculoskeletal: Normal range of motion and muscular tone. She exhibits no edema or tenderness.   Neurological: She is alert and oriented to person, place, and time. No focal deficits.  Skin: Skin is warm. No erythema.   Psychiatric: She has a normal  mood and affect. Judgment, behavior, and thought content normal.       Labs: Reviewed  CBC w/Diff    Lab Results   Component Value Date/Time    WBC 9.3 03/06/2020 03:40 AM    RBC 4.10 03/06/2020 03:40 AM    HGB 12.3 03/06/2020 03:40 AM    HCT 37.7 03/06/2020 03:40 AM    MCV 92.0 03/06/2020 03:40 AM    MCH 30.0 03/06/2020 03:40 AM    MCHC 32.6 03/06/2020 03:40 AM    RDW 14.5 03/06/2020 03:40 AM    PLTCT 255 03/06/2020 03:40 AM    MPV 9.2 03/06/2020 03:40 AM    No results found for: NEUT, ANC, LYMA, ALC, MONA, AMC, EOSA, AEC, BASA, ABC     Comprehensive Metabolic Profile    Lab Results   Component Value Date/Time    NA 141 08/04/2020 12:00 AM    K 3.6 08/04/2020 12:00 AM    K 4.4 03/06/2020 03:40 AM    K 3.9 03/05/2020 03:05 AM    K 4.3 03/04/2020 07:40 PM    CL 101 08/04/2020 12:00 AM    CO2 27.0 08/04/2020 12:00 AM    GAP 17 (H) 08/04/2020 12:00 AM    BUN 11.0 08/04/2020 12:00 AM    CR 0.79 08/04/2020 12:00 AM    CR 0.68 03/06/2020 03:40 AM    CR 0.65 03/05/2020 03:05 AM    CR 0.63 03/04/2020 07:40 PM    GLU 211 (H) 08/04/2020 12:00 AM    Lab Results   Component Value Date/Time    CA 8.8 08/04/2020 12:00 AM    PO4 4.2 03/06/2020 03:40 AM    ALBUMIN 4.2 03/04/2020 07:40 PM    TOTPROT 6.9 03/04/2020 07:40 PM    ALKPHOS 69 03/04/2020 07:40 PM    AST 23 03/04/2020 07:40 PM    ALT 22 03/04/2020 07:40 PM    TOTBILI 1.5 (H) 03/04/2020 07:40 PM    GFR 76.7 08/04/2020 12:00 AM    GFRAA >60 03/06/2020 03:40 AM Cardiac Enzymes Lipids   Lab Results   Component Value Date/Time    BNP 48 08/04/2020 12:00 AM    BNP <10 04/16/2019 12:00 AM    BNP 23 03/25/2019 12:00 AM    Lab Results   Component Value Date    CHOL 105 08/04/2020    CHOL 105 08/04/2020    TRIG 158 (H) 08/04/2020    TRIG 158 08/04/2020    HDL 29 (L) 08/04/2020    HDL 29 08/04/2020    LDL 44 08/04/2020    LDL 44 08/04/2020    VLDL 32 08/04/2020    VLDL 32 08/04/2020    NONHDLCHOL 95 12/11/2017    CHOLHDLC 4 08/04/2020    CHOLHDLC 29 08/04/2020         Coagulation Endocrine   Lab Results   Component Value Date/Time    INR 1.1 03/04/2020 07:40 PM    Lab Results   Component Value Date/Time    HGBA1C 7.2 (H) 09/12/2018 12:00 AM    HGBA1C 6.7 (H) 02/07/2017 12:00 AM    TSH 3.47 09/21/2020 12:00 AM            Imaging/Procedures:      EKG: 03/13/2021  -Sinus bradycardia with ventricular rate of 55    Transthoracic echocardiogram: 11/27/2017   Mildly depressed left ventricular systolic function. LVEF 45-50%  Mild concentric left ventricular hypertrophy.  Mild diastolic dysfunction.   No significant valvular abnormalities.  No pericardial  effusion.     transthoracic echocardiogram: 03/25/2019   ? LVEF=60%  ? Mild Concentric LVH  ? Normal Chamber Dimensions  ? Valves Are Unremarkable  ? No Pericardial Effusion  ? Normal TAPSE=2.1  ? PASP=13mmHg    Transthoracic echocardiogram: 08/17/2020  ? The left ventricular size is normal. The left ventricular wall thickness is normal. Normal geometry. The left ventricular systolic function is normal. The ejection fraction by Simpson's biplane method is 66%. There are no segmental wall motion abnormalities. Normal left ventricular diastolic function.  ? The right ventricular size, wall thickness and systolic function are normal. The pulmonary artery pressure could not be estimated due to inadequate tricuspid regurgitation signal.  ? Structure and function of the valves is normal.  ? The aortic root and ascending aorta are normal in size.  ? No pericardial effusion.  ? Compared to study 03/25/2019, there is no significant change.      Cardiac catheterization: 12/11/2017  FINDINGS:  HEMODYNAMICS:    1. Left ventricular pressure 140/EDP 10 mmHg.  2. Aortic pressure 142/68.   ANATOMY:  1. Left main:  The left main appears to be free of any significant disease.  Arises from the left coronary cusp.  It is normal.  It bifurcates into the left anterior descending and circumflex.  2. Left anterior descending:  The left anterior descending is a type 2 to 3 LAD.  It has mild diffuse irregularities in the proximal to midportion in the range of 30%.  It supplies a smaller 1st diagonal and has a 90% stenosis in the proximal portion.  This, however, is a very small diameter vessel.  All the other diagonals are quite small.  3. Circumflex supplies a single large obtuse marginal that has a 50% stenosis.  The circumflex itself is free of any significant disease.  Proximal to this significant obtuse marginal, there are only very small obtuse marginals.  4. Right coronary artery:  The right coronary artery is dominant, arising from the right coronary cusp.  It has 20% proximal and mid and distal irregularities.  The posterior descending has a 30% stenosis and the posterolateral has a 20% stenosis.  The right coronary artery is dominant.  CONCLUSIONS:    ? Coronary artery disease, although no severe stenoses except for very small diagonal has a 90% stenosis.  ? Normal hemodynamics.  ? Small radial artery leading to discomfort during the procedure, although not prohibitive.  If she needs repeat procedures, it may be more comfortable for her to perform this from a femoral access point.      Assessment/Plan:  Meghan Carlson is a 70 y.o. female who is here for follow-up visit.    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     Marland Kitchen Heart failure with improved ejection fraction   * Etiology - Nonischemic cardiomyopathy   *Diagnosed in 2019-EF of 45% at that time.  Most recent TTE in May 2022 with EF of 66% and normal RV size and function  * NYHA Class I-II.  Stage C. Overall doing well.  * Appears euvolemic on exam.  - HF Therapy:  BB: Carvedilol 12.5 mg twice daily  ACE-I/ARB/ARNI: Losartan 50 mg daily   MRA: None  SGLT2i: Will message endocrinology about SGLT2is  CRT/ICD: N/A  - Volume:   Loop: Furosemide 20 mg as needed  Thiazide:  N/A  - Labs:  BMP + BNP today   - Follow up: 6 months    . Coronary artery disease, nonobstructive   * Cardiac cath in 12/2017 showing small diagonal with 90% stenosis, otherwisee 30% prox-mid LAD and 50% OM and 20% prox-mid RCA stenoses  . Hyperlipidemia  *Patient without anginal symptoms.  - Aspirin 81 mg daily  - Atorvastatin 80 mg daily  - Sublingual nitroglycerin as needed     . Hypertension  . Obesity   . Obstructive sleep apnea   *Home blood pressures within goal (120s over 70s)   -See heart failure management above  -Compliant with CPAP  -Encouraged moderate intensity exercise  -Patient to notify her team if systolic blood pressures are consistently above 1 30-1 40    Return to heart failure clinic: in 6 months    Thank you for allowing me to participate in the care of this patient.  Please do not hesitate to contact me should you have any questions or concerns.     Total time spent on today's office visit was 45 minutes. This includes face-to-face in person visit with patient as well as nonface-to-face time including review of the EMR, outside records, labs, radiologic studies, echocardiogram & other cardiovascular studies, formulation of treatment plan, after visit summary, future disposition, and lastly on documentation.      Idamae Lusher, MD  Advanced Heart Failure and Heart Transplant Cardiologist  [facility]  [facility]

## 2021-03-13 ENCOUNTER — Encounter: Admit: 2021-03-13 | Discharge: 2021-03-13 | Payer: MEDICARE

## 2021-03-13 DIAGNOSIS — E039 Hypothyroidism, unspecified: Secondary | ICD-10-CM

## 2021-03-13 DIAGNOSIS — Z136 Encounter for screening for cardiovascular disorders: Secondary | ICD-10-CM

## 2021-03-13 DIAGNOSIS — I5042 Chronic combined systolic (congestive) and diastolic (congestive) heart failure: Secondary | ICD-10-CM

## 2021-03-13 DIAGNOSIS — E119 Type 2 diabetes mellitus without complications: Secondary | ICD-10-CM

## 2021-03-13 DIAGNOSIS — I2510 Atherosclerotic heart disease of native coronary artery without angina pectoris: Secondary | ICD-10-CM

## 2021-03-13 DIAGNOSIS — I10 Essential (primary) hypertension: Secondary | ICD-10-CM

## 2021-03-13 DIAGNOSIS — R9439 Abnormal result of other cardiovascular function study: Secondary | ICD-10-CM

## 2021-03-13 DIAGNOSIS — G4733 Obstructive sleep apnea (adult) (pediatric): Secondary | ICD-10-CM

## 2021-03-13 DIAGNOSIS — E669 Obesity, unspecified: Secondary | ICD-10-CM

## 2021-03-13 DIAGNOSIS — E1169 Type 2 diabetes mellitus with other specified complication: Secondary | ICD-10-CM

## 2021-03-13 DIAGNOSIS — E785 Hyperlipidemia, unspecified: Secondary | ICD-10-CM

## 2021-03-30 ENCOUNTER — Encounter: Admit: 2021-03-30 | Discharge: 2021-03-30 | Payer: MEDICARE

## 2021-03-30 MED ORDER — ATORVASTATIN 80 MG PO TAB
80 mg | ORAL_TABLET | Freq: Every day | ORAL | 3 refills | Status: AC
Start: 2021-03-30 — End: ?

## 2021-05-04 ENCOUNTER — Encounter: Admit: 2021-05-04 | Discharge: 2021-05-04 | Payer: MEDICARE

## 2021-05-04 DIAGNOSIS — E119 Type 2 diabetes mellitus without complications: Secondary | ICD-10-CM

## 2021-05-15 ENCOUNTER — Encounter: Admit: 2021-05-15 | Discharge: 2021-05-15 | Payer: MEDICARE

## 2021-05-15 ENCOUNTER — Ambulatory Visit: Admit: 2021-05-15 | Discharge: 2021-05-15 | Payer: MEDICARE

## 2021-05-15 DIAGNOSIS — E119 Type 2 diabetes mellitus without complications: Secondary | ICD-10-CM

## 2021-05-15 DIAGNOSIS — E785 Hyperlipidemia, unspecified: Secondary | ICD-10-CM

## 2021-05-15 DIAGNOSIS — E039 Hypothyroidism, unspecified: Secondary | ICD-10-CM

## 2021-05-15 DIAGNOSIS — E6609 Other obesity due to excess calories: Secondary | ICD-10-CM

## 2021-05-15 DIAGNOSIS — G4733 Obstructive sleep apnea (adult) (pediatric): Secondary | ICD-10-CM

## 2021-05-15 DIAGNOSIS — E1165 Type 2 diabetes mellitus with hyperglycemia: Secondary | ICD-10-CM

## 2021-05-15 DIAGNOSIS — R9439 Abnormal result of other cardiovascular function study: Secondary | ICD-10-CM

## 2021-05-15 DIAGNOSIS — E669 Obesity, unspecified: Secondary | ICD-10-CM

## 2021-05-15 MED ORDER — OZEMPIC 0.25 MG OR 0.5 MG(2 MG/1.5 ML) SC PNIJ
.25 mg | SUBCUTANEOUS | 0 refills | Status: AC
Start: 2021-05-15 — End: ?

## 2021-05-15 NOTE — Progress Notes
Date of Service: 05/15/2021    Subjective:             Meghan Carlson is a 71 y.o. female is here for DM and hypothyroidism follow up.    History of Present Illness  Meghan Carlson is a 71 y.o. female with past medical history of diabetes mellitus, hypothyroidism, pituitary gland surgery, diastolic heart failure is?here for follow up.    Pt last saw Dr. Sherral Hammers on 11/22/20. Pt is new to me today.   ?   Interval history  Pt reporting ongoing diarrhea even since transitioning to XR metformin in June 2022. Pt reports it hinders her daily activities. Was started on glipizide at last appointment--tolerating well. Denies any hypoglycemia. Has questions about Ozempic and Invokana/Jardiance today.  Met with Dorathy Daft, RD following last visit for diabetes education and nutritional counseling. Has been trying to cut back on serving sizes.   ?  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 Was 7.9% previously in August 2022.    Current regimen:?Metformin 1G BID, glipizide 2.5 mg daily   Previously tried: Invokana (stopped due to recurrent UTI)   She consumes two meals a day: lunch 1100-1200 and dinner 7-8 pm  POC checks: morning usually, 170-200s; up to 270s during the day , drink ice tea, pop corn occasionally before going to bed;   -14 day average: 192 mg/dl   -30 day average: 161 mg/dl     Complication:  Retinopathy/Vision: ?eye exam on 11/23/20   Peripheral neuropathy/Feet:?no  Autonomic neuropathy:?no  Nephropathy:?no  Macrovascular complications:?No  ?  No?History of pancreatitis.?No history of thyroid cancer in family.  ?  Meds:  On ACEi/ARB:?Losartan  On Statin:?Lipitor  ?  Dyslipidemia  ? On atorvastatin 80 mg daily  ?  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 Dx- 2010  ? PTA-levothyroxine?50?mcg daily, taking appropriately   Latest Reference Range & Units 09/21/20 00:00   T4-Free 0.70 - 1.48 ng/dL 0.96   TSH 0.45 - 4.09 uIU/mL 3.47     History of pituitary?surgery  ? 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 Post surgery not on any hormonal replacement.  ?  Hormonal workup done on 12/2019?did not show cortisol or ACTH deficiency.  ? Denies any s/s adrenal insufficiency--no fatigue, dizziness, orthostatics   ?  Hypercalciuria  ? H/O  2 episodes of kidney stone.  last in 2020 per patient. None since last visit.   ? Right wrist fracture with mild trauma in 1990?after trauma  ? Was on IM steroid injection for 2 years.  ? No family history of osteoporosis or osteopenia or parathyroid disease  She is not taking calcium tablets  Taking 1-2 serving of dairy products  ? 08/04/2020 00:00   Sodium, 24 HR 66   Sodium, Random 66   Creatinine, Random 52.64   Microalbumin, Random <5.0   Calcium 24 Hr 341.3 (A)   Creatinine, Ur 31.05 (A)   Urine Volume 3,750   Creatinine, 24 HR Urine 1.2   Microalbumin/CR ratio Urine *   ?  Vitamin D deficiency  ? 12/24/2019  08/04/2020   Vitamin D(25-OH)Total 19 (A) 31   Takes Vitamin D daily, doesn't know the dose   ?  Osteopenia  ? Bone density done on 04/2020 showed osteopenia with low FRAX score?(although very close to osteoporosis). No falls or fractures since last visit.   ?     Review of Systems   Constitutional: Negative for fatigue and  fever.   HENT: Negative for sore throat.    Eyes: Negative for visual disturbance.   Respiratory: Negative for shortness of breath and wheezing.    Cardiovascular: Negative for chest pain, palpitations and leg swelling.   Gastrointestinal: Positive for diarrhea.   Endocrine: Negative for cold intolerance and heat intolerance.   Skin: Negative for wound.   Neurological: Negative for dizziness and light-headedness.   Psychiatric/Behavioral: Negative for behavioral problems.     Objective:         ? albuterol sulfate (PROAIR HFA) 90 mcg/actuation HFA aerosol inhaler Inhale 2 puffs by mouth into the lungs every 6 hours as needed for Wheezing or Shortness of Breath. Shake well before use.   ? aspirin (ASPIRIN CHILDRENS) 81 mg chewable tablet Chew one tablet by mouth daily. Take with food.   ? atorvastatin (LIPITOR) 80 mg tablet Take one tablet by mouth daily.   ? carvediloL (COREG) 12.5 mg tablet Take one tablet by mouth twice daily with meals. Take with food.   ? diclofenac sodium (VOLTAREN-XR) 100 mg xr tablet Take 100 mg by mouth daily. Take with food.   ? escitalopram oxalate (LEXAPRO) 20 mg tablet Take 20 mg by mouth daily.   ? fluticasone propionate (FLONASE) 50 mcg/actuation nasal spray Apply  to each nostril as directed as Needed. Shake bottle gently before using.    ? furosemide (LASIX) 20 mg tablet TAKE 2 TABLETS BY MOUTH IN THE MORNING AS NEEDED FOR SWELLING OR WEIGHT GAIN   ? glipiZIDE (GLUCOTROL) 5 mg tablet Take one-half tablet by mouth daily with lunch. Indications: type 2 diabetes mellitus   ? levothyroxine (SYNTHROID) 50 mcg tablet TAKE ONE TABLET BY MOUTH DAILY 30 MINUTES BEFORE BREAKFAST.   ? losartan (COZAAR) 50 mg tablet TAKE 1 TABLET BY MOUTH EVERY DAY   ? metFORMIN-XR (GLUCOPHAGE XR) 500 mg extended release tablet TAKE 4 TABLETS BY MOUTH DAILY WITH DINNER.   ? nitroglycerin (NITROSTAT) 0.4 mg tablet Place 1 tab under tongue every 5 minutes as needed for Chest Pain (Not to exceed 3 doses /15 min. If pain persists , seek medical attention   ? oxycodone(+) (ROXICODONE, OXY-IR) 20 mg tablet Take 20 mg by mouth every 6 hours as needed   ? semaglutide (OZEMPIC) 0.25 mg or 0.5 mg(2 mg/1.5 mL) injection PEN Inject one-quarter mg under the skin every 7 days.   ? triamcinolone acetonide (KENALOG) 0.1 % topical cream Apply  topically to affected area as Needed.   ? valACYclovir (VALTREX) 500 mg tablet Take 500 mg by mouth every 24 hours.     Vitals:    05/15/21 1356   BP: (!) 143/62   BP Source: Arm, Left Upper   Pulse: 63   Temp: 36.6 ?C (97.9 ?F)   TempSrc: Skin   PainSc: Zero   Weight: 85.4 kg (188 lb 3.2 oz)   Height: 160 cm (5' 3)     Body mass index is 33.34 kg/m?Marland Kitchen     Wt Readings from Last 3 Encounters:   05/15/21 85.4 kg (188 lb 3.2 oz)   03/13/21 85.3 kg (188 lb)   11/22/20 86.5 kg (190 lb 12.8 oz)     Physical Exam  Vitals and nursing note reviewed.   Constitutional:       General: She is not in acute distress.     Appearance: Normal appearance. She is obese.   HENT:      Mouth/Throat:      Mouth: Mucous membranes are moist.  Pharynx: Oropharynx is clear.   Eyes:      Conjunctiva/sclera: Conjunctivae normal.   Cardiovascular:      Rate and Rhythm: Normal rate and regular rhythm.      Pulses: Normal pulses.      Heart sounds: Normal heart sounds.   Pulmonary:      Effort: Pulmonary effort is normal.      Breath sounds: Normal breath sounds.   Musculoskeletal:      Cervical back: Normal range of motion.      Right lower leg: No edema.      Left lower leg: No edema.   Lymphadenopathy:      Cervical: No cervical adenopathy.   Skin:     General: Skin is warm.      Coloration: Skin is not jaundiced.   Neurological:      Mental Status: She is alert.   Psychiatric:         Mood and Affect: Mood normal.         Behavior: Behavior normal.         Thought Content: Thought content normal.          Assessment and Plan:  Diabetes mellitus type 2  ? Currently on metformin 1000 mg BID and glipizide 2.5 mg daily   A1c 7.6%, ideally goal <7.5% due to comorbidities without hypoglycemia   Ozempic was cost prohibitive in the past. SGLTi discontinued due to UTI's.  -pt would like to see if Ozempic is better covered this year. Sent RX with titration schedule. Discussed risks, benefits.   -Given fasting blood sugars above target, basal insulin may be next best option.  -Patient will inform us if cost is an issue for her.   -Continue glipizide 2.5 mg daily for now.   -Trial holding metformin to see if diarrhea improves.   -continue lifestyle modifications     Diabetes mellitus annual monitoring  Urine microalbumin:? No microalbuminuria on 08/04/2020  Lipid panel:?? 08/04/2020 shows LDL within goal  Blood chemistry:?last done on 07/2020 with normal creatinine  Eye examination:?recommended to make new appointment  Foot examination:?done on 04/18/2020  ?  Hypothyroidism  ? Currently on levothyroxine?50 mcg daily.  ? Last TFT's in range  ? Repeat in May/June 2023   ?  Osteopenia  ? Bone density done on 04/2020 showed osteopenia with low FRAX score?(although very close to osteoporosis).  ? Repeat Bone density in 2 years or earlier if clinically indicated (BMD due January 2024)  ? Continue with vitamin D supplementation--adequate calcium in diet   ?  Vitamin D deficiency  -Recent vitamin D is normal.  Continue with vitamin D supplementation  ?   Hypercalciuria  ? 24-hour urine calcium on 07/27/2020 showed hypercalciuria.  This was done while she was on Lasix.  Last episode of kidney stone was in 2020 per patient report  ? Recommend to have 2-3 servings of dietary calcium and not to take any calcium tablets or Tums orally  ? Advised to contact us if she develops any new kidney stones    Obesity, class 1:   -BMI 33.34; counseled pt on lifestyle modifications including dietary choices and increased activity as tolerated  -starting GLP1 would be beneficial from weight and cardiovascular perspective

## 2021-05-16 ENCOUNTER — Encounter: Admit: 2021-05-16 | Discharge: 2021-05-16 | Payer: MEDICARE

## 2021-05-16 DIAGNOSIS — E1165 Type 2 diabetes mellitus with hyperglycemia: Secondary | ICD-10-CM

## 2021-05-16 MED ORDER — DEXCOM G6 SENSOR MISC DEVI
3 refills | 30.00000 days | Status: AC
Start: 2021-05-16 — End: ?

## 2021-05-16 MED ORDER — DEXCOM G6 RECEIVER MISC MISC
0 refills | Status: AC
Start: 2021-05-16 — End: ?

## 2021-05-16 MED ORDER — DEXCOM G6 TRANSMITTER MISC DEVI
3 refills | 30.00000 days | Status: AC
Start: 2021-05-16 — End: ?

## 2021-05-16 NOTE — Telephone Encounter
-----  Message from Lamount Cohen, APRN-NP sent at 05/16/2021  9:34 AM CST -----  Please order Dexcom G6 kit through [facility]. Thank you!!    -AO

## 2021-05-16 NOTE — Telephone Encounter
Dexcom G6 Rx sent to [facility], as [facility] use is not authorized for this clinic.

## 2021-05-18 ENCOUNTER — Encounter: Admit: 2021-05-18 | Discharge: 2021-05-18 | Payer: MEDICARE

## 2021-05-18 NOTE — Telephone Encounter
Received a fax from [facility] stating Patients information sha been sent to [facility] for review of medical coverage

## 2021-05-24 ENCOUNTER — Encounter: Admit: 2021-05-24 | Discharge: 2021-05-24 | Payer: MEDICARE

## 2021-05-24 MED ORDER — METFORMIN 500 MG PO TB24
ORAL_TABLET | 1 refills
Start: 2021-05-24 — End: ?

## 2021-05-30 ENCOUNTER — Encounter: Admit: 2021-05-30 | Discharge: 2021-05-30 | Payer: MEDICARE

## 2021-05-30 DIAGNOSIS — E038 Other specified hypothyroidism: Secondary | ICD-10-CM

## 2021-05-30 NOTE — Telephone Encounter
Rcvd supply order form from [employer] [facility]. Routed to [place] for signature. Office note date 06Feb2023 printed.

## 2021-06-01 ENCOUNTER — Encounter: Admit: 2021-06-01 | Discharge: 2021-06-01 | Payer: MEDICARE

## 2021-06-23 ENCOUNTER — Encounter: Admit: 2021-06-23 | Discharge: 2021-06-23 | Payer: MEDICARE

## 2021-08-07 ENCOUNTER — Encounter: Admit: 2021-08-07 | Discharge: 2021-08-07 | Payer: MEDICARE

## 2021-08-07 ENCOUNTER — Ambulatory Visit: Admit: 2021-08-07 | Discharge: 2021-08-08 | Payer: MEDICARE

## 2021-08-07 VITALS — BP 101/52 | HR 55 | Temp 98.10000°F | Ht 63.0 in | Wt 190.0 lb

## 2021-08-07 DIAGNOSIS — E039 Hypothyroidism, unspecified: Secondary | ICD-10-CM

## 2021-08-07 DIAGNOSIS — E6609 Other obesity due to excess calories: Secondary | ICD-10-CM

## 2021-08-07 DIAGNOSIS — E785 Hyperlipidemia, unspecified: Secondary | ICD-10-CM

## 2021-08-07 DIAGNOSIS — R9439 Abnormal result of other cardiovascular function study: Secondary | ICD-10-CM

## 2021-08-07 DIAGNOSIS — E669 Obesity, unspecified: Secondary | ICD-10-CM

## 2021-08-07 DIAGNOSIS — G4733 Obstructive sleep apnea (adult) (pediatric): Secondary | ICD-10-CM

## 2021-08-07 DIAGNOSIS — E119 Type 2 diabetes mellitus without complications: Secondary | ICD-10-CM

## 2021-08-07 MED ORDER — GLIPIZIDE 5 MG PO TAB
5 mg | ORAL_TABLET | Freq: Every day | ORAL | 3 refills | Status: AC
Start: 2021-08-07 — End: ?

## 2021-08-07 NOTE — Progress Notes
Date of Service: 08/07/2021    Subjective:             Meghan Carlson is a 71 y.o. female is here for DM and hypothyroidism follow up.    History of Present Illness  Meghan Carlson is a 71 y.o. female with past medical history of diabetes mellitus, hypothyroidism, pituitary gland surgery, diastolic heart failure is?here for follow up.    Pt last saw Dr. Sherral Hammers on 11/22/20. Pt is new to me today.   ?   Interval history  Metformin XR was was paused last appointment due to worsening and severe diarrhea. Pt reports episodes decreased some but she continues to have diarrhea without any true pattern. We attempted to try Ozempic but could not afford.     Pt has been checking blood sugars 2-3x/day. Dexcom was ordered last appt but pt has not received. Blood sugars running 200-300s. No blurry vision, no polyuria, polydipsia.   ?  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   Was 7.6% previously in February 2023    Current regimen:?  glipizide 2.5 mg daily     Previously tried:   Invokana (stopped due to recurrent UTI)   Metformin 1G BID (diarhea)    She consumes two meals a day: lunch 1100-1200 and dinner 7-8 pm  POC checks: morning usually, 200s-240s up to 350s during the day , drink ice tea, pop corn occasionally before going to bed; has stopped drinking soda pop     Complication:  Retinopathy/Vision: ?eye exam on 11/23/20   Peripheral neuropathy/Feet:?no  Autonomic neuropathy:?no  Nephropathy:?no  Macrovascular complications:?No  ?  No?History of pancreatitis.?No history of thyroid cancer in family.  ?  Meds:  On ACEi/ARB:?Losartan  On Statin:?Lipitor  ?  Dyslipidemia  ? On atorvastatin 80 mg daily  ?  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 Dx- 2010  ? 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 Post surgery not on any hormonal replacement.  ?  Hormonal workup done on 12/2019?did not show cortisol or ACTH deficiency.  ?  Denies any s/s adrenal insufficiency--no fatigue, dizziness, orthostatics   ?  Hypercalciuria  ? H/O  2 episodes of kidney stone.  last in 2020 per patient. None since last visit.   ? Right wrist fracture with mild trauma in 1990?after trauma  ? Was on IM steroid injection for 2 years.  ? No family history of osteoporosis or osteopenia or parathyroid disease  ?   She is not taking calcium tablets  Taking 1-2 serving of dairy products  ? 08/04/2020 00:00   Sodium, 24 HR 66   Sodium, Random 66   Creatinine, Random 52.64   Microalbumin, Random <5.0   Calcium 24 Hr 341.3 (A)   Creatinine, Ur 31.05 (A)   Urine Volume 3,750   Creatinine, 24 HR Urine 1.2   Microalbumin/CR ratio Urine *   ?  Vitamin D deficiency  ? 12/24/2019  08/04/2020   Vitamin D(25-OH)Total 19 (A) 31   Takes Vitamin D daily, doesn't know the dose   ?  Osteopenia  ? Bone density done on 04/2020 showed osteopenia with low FRAX score?(although very close to osteoporosis). No falls or fractures since last visit.   ?  Review of Systems   Constitutional: Negative for fatigue and fever.   HENT: Negative for sore throat.    Eyes: Negative for visual disturbance.   Respiratory: Negative for shortness of breath and wheezing.    Cardiovascular: Negative for chest pain, palpitations and leg swelling.   Gastrointestinal: Positive for diarrhea.   Endocrine: Negative for cold intolerance and heat intolerance.   Skin: Negative for wound.   Neurological: Negative for dizziness and light-headedness.   Psychiatric/Behavioral: Negative for behavioral problems.     Objective:         ? albuterol sulfate (PROAIR HFA) 90 mcg/actuation HFA aerosol inhaler Inhale 2 puffs by mouth into the lungs every 6 hours as needed for Wheezing or Shortness of Breath. Shake well before use.   ? aspirin (ASPIRIN CHILDRENS) 81 mg chewable tablet Chew one tablet by mouth daily. Take with food.   ? atorvastatin (LIPITOR) 80 mg tablet Take one tablet by mouth daily.   ? carvediloL (COREG) 12.5 mg tablet Take one tablet by mouth twice daily with meals. Take with food.   ? DEXCOM G6 RECEIVER receiver device Use to check blood sugars.  Indications: type 2 diabetes mellitus, E11.65   ? DEXCOM G6 SENSOR sensor device Use to check blood sugars. Replace sensor every 10 days.  Indications: type 2 diabetes mellitus, E11.65   ? DEXCOM G6 TRANSMITTER transmitter device Use to check blood sugars. Replace transmitter every 90 days.  Indications: type 2 diabetes mellitus, E11.65   ? diclofenac sodium (VOLTAREN-XR) 100 mg xr tablet Take 100 mg by mouth daily. Take with food.   ? escitalopram oxalate (LEXAPRO) 20 mg tablet Take 20 mg by mouth daily.   ? fluticasone propionate (FLONASE) 50 mcg/actuation nasal spray Apply  to each nostril as directed as Needed. Shake bottle gently before using.    ? furosemide (LASIX) 20 mg tablet TAKE 2 TABLETS BY MOUTH IN THE MORNING AS NEEDED FOR SWELLING OR WEIGHT GAIN   ? glipiZIDE (GLUCOTROL) 5 mg tablet Take one-half tablet by mouth daily with lunch. Indications: type 2 diabetes mellitus   ? levothyroxine (SYNTHROID) 50 mcg tablet TAKE ONE TABLET BY MOUTH DAILY 30 MINUTES BEFORE BREAKFAST.   ? losartan (COZAAR) 50 mg tablet TAKE 1 TABLET BY MOUTH EVERY DAY   ? metFORMIN-XR (GLUCOPHAGE XR) 500 mg extended release tablet Two tabs by mouth daily.   ? nitroglycerin (NITROSTAT) 0.4 mg tablet Place 1 tab under tongue every 5 minutes as needed for Chest Pain (Not to exceed 3 doses /15 min. If pain persists , seek medical attention   ? oxycodone(+) (ROXICODONE, OXY-IR) 20 mg tablet Take 20 mg by mouth every 6 hours as needed   ? triamcinolone acetonide (KENALOG) 0.1 % topical cream Apply  topically to affected area as Needed.   ? valACYclovir (VALTREX) 500 mg tablet Take 500 mg by mouth every 24 hours.     Vitals:    08/07/21 1301   BP: 101/52   BP Source: Arm, Left Upper   Pulse: 55   Temp: 36.7 ?C (98.1 ?F)   PainSc: Zero   Weight: 86.2 kg (190 lb)   Height: 160 cm (5' 3)     Body mass index is 33.66 kg/m?Marland Kitchen     Wt Readings from Last 3 Encounters:   08/07/21 86.2 kg (190 lb)   05/15/21 85.4 kg (188 lb 3.2 oz)   03/13/21 85.3 kg (188 lb)     Physical Exam  Vitals and nursing note reviewed.  Constitutional:       General: She is not in acute distress.     Appearance: Normal appearance. She is obese.   HENT:      Mouth/Throat:      Mouth: Mucous membranes are moist.      Pharynx: Oropharynx is clear.   Eyes:      Conjunctiva/sclera: Conjunctivae normal.   Cardiovascular:      Rate and Rhythm: Normal rate and regular rhythm.      Pulses: Normal pulses.      Heart sounds: Normal heart sounds.   Pulmonary:      Effort: Pulmonary effort is normal.      Breath sounds: Normal breath sounds.   Musculoskeletal:      Cervical back: Normal range of motion.      Right lower leg: No edema.      Left lower leg: No edema.   Lymphadenopathy:      Cervical: No cervical adenopathy.   Skin:     General: Skin is warm.      Coloration: Skin is not jaundiced.   Neurological:      Mental Status: She is alert.   Psychiatric:         Mood and Affect: Mood normal.         Behavior: Behavior normal.         Thought Content: Thought content normal.          Assessment and Plan:  Diabetes mellitus type 2  ? glipizide 2.5 mg daily   A1c 8.5%, ideally goal <7.5% due to comorbidities without hypoglycemia   Ozempic was cost prohibitive. SGLTi discontinued due to UTI's.  -she initially reported slight improvement in diarrhea after stopping metformin but pt reports she is still having diarrhea  -we will increase glipizide to 5 mg daily  -if BG >200 over the next month, we may need to start basal insulin if we cannot get patient on Ozempic  -she is not willing to restart metformin at this time   -referred for patient assistance for Ozempic --red cap survey completed for patient assistance team -continue lifestyle modifications     Diabetes mellitus annual monitoring  Urine microalbumin:? No microalbuminuria on 08/04/2020--ordered repeat   Lipid panel:?? 08/04/2020 shows LDL within goal--ordered repeat   Blood chemistry:?last done on 07/2020 with normal creatinine--ordered repeat   Eye examination:?recommended to make new appointment  Foot examination:?done on Feb 2023  ?  Hypothyroidism  ? Currently on levothyroxine?50 mcg daily.  ? Last TFT's in range  ? Repeat in May/June 2023   ?  Osteopenia  ? Bone density done on 04/2020 showed osteopenia with low FRAX score?(although very close to osteoporosis).  ? Repeat Bone density in 2 years or earlier if clinically indicated (BMD due January 2024)  ? Continue with vitamin D supplementation--adequate calcium in diet   ?  Vitamin D deficiency  -Recent vitamin D is normal.  Continue with vitamin D supplementation.   ?   Hypercalciuria  ? 24-hour urine calcium on 07/27/2020 showed hypercalciuria.  This was done while she was on Lasix.  Last episode of kidney stone was in 2020 per patient report  ? Recommend to have 2-3 servings of dietary calcium and not to take any calcium tablets or Tums orally  ? Advised to contact us if she develops any new kidney stones    Obesity, class 1:   -BMI 33.66; counseled pt on lifestyle modifications including dietary choices and increased activity as tolerated  -  starting GLP1 would be beneficial from weight and cardiovascular perspective --we will see if pt qualifies for patient assistance

## 2021-08-08 DIAGNOSIS — E1165 Type 2 diabetes mellitus with hyperglycemia: Principal | ICD-10-CM

## 2021-08-10 ENCOUNTER — Encounter: Admit: 2021-08-10 | Discharge: 2021-08-10 | Payer: MEDICARE

## 2021-08-10 MED ORDER — LOSARTAN 50 MG PO TAB
50 mg | ORAL_TABLET | Freq: Every day | ORAL | 3 refills | 90.00000 days | Status: AC
Start: 2021-08-10 — End: ?

## 2021-08-14 ENCOUNTER — Encounter: Admit: 2021-08-14 | Discharge: 2021-08-14 | Payer: MEDICARE

## 2021-08-14 NOTE — Progress Notes
Medication Assistance Program packet has been sent to patient for signatures and other required documents (if applicable) . Once received back, medication assistance coordinator will begin processing     Exander Shaul  Medication Assistance Coordinator   [phone]

## 2021-08-17 ENCOUNTER — Encounter: Admit: 2021-08-17 | Discharge: 2021-08-17 | Payer: MEDICARE

## 2021-08-17 NOTE — Telephone Encounter
Pt had labs done and recvd results but has not heard form this office;no results recvd yet. Pt to call lab and give this office fax number to send results    Updated pot that MAP packet has been mailed; has not recvd yet and will call next week if not rcvd.     Pt doesn't qualify for CGM due to no insulin use and no level 2 or level 3 hypoglycemia events.

## 2021-08-18 ENCOUNTER — Encounter: Admit: 2021-08-18 | Discharge: 2021-08-18 | Payer: MEDICARE

## 2021-08-18 DIAGNOSIS — E1165 Type 2 diabetes mellitus with hyperglycemia: Secondary | ICD-10-CM

## 2021-10-25 ENCOUNTER — Encounter: Admit: 2021-10-25 | Discharge: 2021-10-25 | Payer: MEDICARE

## 2021-10-27 ENCOUNTER — Encounter: Admit: 2021-10-27 | Discharge: 2021-10-27 | Payer: MEDICARE

## 2021-12-15 ENCOUNTER — Encounter: Admit: 2021-12-15 | Discharge: 2021-12-15 | Payer: MEDICARE

## 2021-12-18 ENCOUNTER — Encounter: Admit: 2021-12-18 | Discharge: 2021-12-18 | Payer: MEDICARE

## 2021-12-18 DIAGNOSIS — I2510 Atherosclerotic heart disease of native coronary artery without angina pectoris: Secondary | ICD-10-CM

## 2021-12-18 DIAGNOSIS — E119 Type 2 diabetes mellitus without complications: Secondary | ICD-10-CM

## 2021-12-18 DIAGNOSIS — J449 Chronic obstructive pulmonary disease, unspecified: Secondary | ICD-10-CM

## 2021-12-18 DIAGNOSIS — Z136 Encounter for screening for cardiovascular disorders: Secondary | ICD-10-CM

## 2021-12-18 DIAGNOSIS — E7800 Pure hypercholesterolemia, unspecified: Secondary | ICD-10-CM

## 2021-12-18 DIAGNOSIS — F32A Depression: Secondary | ICD-10-CM

## 2021-12-18 DIAGNOSIS — I10 Essential (primary) hypertension: Secondary | ICD-10-CM

## 2021-12-18 DIAGNOSIS — I259 Chronic ischemic heart disease, unspecified: Secondary | ICD-10-CM

## 2021-12-18 DIAGNOSIS — I5042 Chronic combined systolic (congestive) and diastolic (congestive) heart failure: Secondary | ICD-10-CM

## 2021-12-18 DIAGNOSIS — Q249 Congenital malformation of heart, unspecified: Secondary | ICD-10-CM

## 2021-12-18 DIAGNOSIS — Z780 Asymptomatic menopausal state: Secondary | ICD-10-CM

## 2021-12-18 DIAGNOSIS — E785 Hyperlipidemia, unspecified: Secondary | ICD-10-CM

## 2021-12-18 DIAGNOSIS — E1169 Type 2 diabetes mellitus with other specified complication: Secondary | ICD-10-CM

## 2021-12-18 DIAGNOSIS — E039 Hypothyroidism, unspecified: Secondary | ICD-10-CM

## 2021-12-18 DIAGNOSIS — R9439 Abnormal result of other cardiovascular function study: Secondary | ICD-10-CM

## 2021-12-18 DIAGNOSIS — G4733 Obstructive sleep apnea (adult) (pediatric): Secondary | ICD-10-CM

## 2021-12-18 DIAGNOSIS — E669 Obesity, unspecified: Secondary | ICD-10-CM

## 2021-12-18 DIAGNOSIS — R0609 Other forms of dyspnea: Secondary | ICD-10-CM

## 2021-12-18 NOTE — Telephone Encounter
Received voicemail from patient  Patient has an upcoming appt with Ashlee  Patient wanting to know if she needs labs  Routing to [employer]

## 2021-12-18 NOTE — Progress Notes
Date of Service: 12/18/2021      Patient: Meghan Carlson  ZOX:[id number]  DOB: [birth year]  Her PCP is Burnadette Pop.      Problem List:  Patient Active Problem List    Diagnosis Date Noted   ? Chronic combined systolic and diastolic heart failure (HCC) 03/13/2021   ? Lactic acidosis 03/07/2020   ? Subdural hematoma (HCC) 03/04/2020   ? Coronary artery disease involving native coronary artery of native heart without angina pectoris 08/25/2018   ? Carotid artery calcification 03/18/2018   ? Abnormal stress test 12/11/2017   ? DOE (dyspnea on exertion) 11/28/2017   ? Hypothyroidism 11/11/2017   ? Dyslipidemia 11/11/2017   ? Essential hypertension 11/11/2017   ? Obesity 11/11/2017   ? Diabetes mellitus (HCC) 11/11/2017   ? OSA (obstructive sleep apnea) 11/11/2017         Subjective:  Meghan Carlson is a 71 y.o. female presents to the advanced heart failure clinic for Follow-up visit. Patient previously managed by Dr. Pierre Bali and established care with me in 03/2021.     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     Patient last seen in HF clinic on 03/2021. At this visit, patient was doing overall well and reported stable symptoms of dyspnea on exertion while going up hills or stairs. The following recommendations were made:   -No medication changes   -BMP + BNP  -F/U in 6 months    Today the patient presents to the clinic visit alone.   She continues to have stable dyspnea while walking up hills or stairs.   She denies chest pain, chest pressure/discomfort, palpitations, irregular heart beats, near-syncope, syncope, fatigue, orthopnea, paroxysmal nocturnal dyspnea, lower extremity edema.   She takes furosemide 20 mg about once a day or every other day when she notices abdominal bloating or lower extremity swelling.   Current diet: well balanced She is adherent to low salt diet.  Current exercise: no regular exercise - thinking about getting L knee surgery to help with mobility   She states she is compliant with medications.   Home BP readings are around 120/60s, rarely 130s over 70s  Home weight has been stable around 188 -191 pounds.  Patient's endocrinologist is looking into the cost of Ozempic.       Medications:  Current Outpatient Medications   Medication Instructions   ? albuterol sulfate (PROAIR HFA) 90 mcg/actuation HFA aerosol inhaler 2 puffs, Inhalation, EVERY  6 HOURS PRN, Shake well before use.    ? aspirin (ASPIRIN CHILDRENS) 81 mg, Oral, DAILY, Take with food.   ? atorvastatin (LIPITOR) 80 mg, Oral, DAILY   ? carvediloL (COREG) 12.5 mg, Oral, TWICE DAILY WITH MEALS, Take with food.   ? DEXCOM G6 RECEIVER receiver device Use to check blood sugars.   ? DEXCOM G6 SENSOR sensor device Use to check blood sugars. Replace sensor every 10 days.   ? DEXCOM G6 TRANSMITTER transmitter device Use to check blood sugars. Replace transmitter every 90 days.   ? diclofenac sodium (VOLTAREN-XR) 100 mg, Oral, DAILY, Take with food.    ? escitalopram oxalate (LEXAPRO) 20 mg, Oral, DAILY   ? fluticasone propionate (FLONASE) 50 mcg/actuation nasal spray Each Nostril, AS NEEDED, Shake bottle gently before using.   ? furosemide (LASIX) 20 mg tablet TAKE 2 TABLETS BY MOUTH IN THE MORNING AS NEEDED FOR  SWELLING OR WEIGHT GAIN   ? glipiZIDE (GLUCOTROL) 5 mg, Oral, DAILY WITH LUNCH   ? levothyroxine (SYNTHROID) 50 mcg, Oral, DAILY BEFORE BREAKFAST   ? losartan (COZAAR) 50 mg, Oral, DAILY   ? metFORMIN-XR (GLUCOPHAGE XR) 500 mg extended release tablet Two tabs by mouth daily.   ? nitroglycerin (NITROSTAT) 0.4 mg tablet Place 1 tab under tongue every 5 minutes as needed for Chest Pain (Not to exceed 3 doses /15 min. If pain persists , seek medical attention   ? oxyCODONE 20 mg, Oral, EVERY  6 HOURS PRN   ? triamcinolone acetonide (KENALOG) 0.1 % topical cream Topical, AS NEEDED   ? valACYclovir (VALTREX) 500 mg, Oral, EVERY 24 HOURS        Allergies:   Allergies   Allergen Reactions   ? Peanut SWOLLEN TONGUE   ? Lisinopril COUGH   ? Seasonal Allergies RHINITIS   ? Sulfonamide [Sulfa (Sulfonamide Antibiotics)] ITCHING       Objective:  Ht 160 cm (5' 3)  - Wt 87.1 kg (192 lb)  - BMI 34.01 kg/m?   BP Readings from Last 3 Encounters:   08/07/21 101/52   05/15/21 (!) 143/62   03/13/21 (!) 140/72      Pulse Readings from Last 3 Encounters:   08/07/21 55   05/15/21 63   03/13/21 55     Wt Readings from Last 8 Encounters:   12/18/21 87.1 kg (192 lb)   08/07/21 86.2 kg (190 lb)   05/15/21 85.4 kg (188 lb 3.2 oz)   03/13/21 85.3 kg (188 lb)   11/22/20 86.5 kg (190 lb 12.8 oz)   08/17/20 87.7 kg (193 lb 6.4 oz)   08/10/20 88.6 kg (195 lb 6.4 oz)   07/14/20 87.6 kg (193 lb 3.2 oz)       Constitutional: She appears well-developed and well-nourished.   HENT:  Head: Normocephalic.    Eyes: Conjunctivae are normal.   Neck: Normal range of motion. CVP of 7. .  Cardiovascular: Normal rate, regular rhythm, normal heart sounds and intact distal pulses. No murmur.  Pulmonary/Chest: Effort normal and breath sounds normal. No respiratory distress. She has no wheezes or rales.   Abdominal: Soft. Bowel sounds are normal. She exhibits no distension.   Musculoskeletal: Normal range of motion and muscular tone. She exhibits no edema.   Neurological: She is alert and oriented to person, place, and time. No focal deficits.  Skin: Skin is warm. No erythema.   Psychiatric: She has a normal mood and affect. Judgment, behavior, and thought content normal.       Labs: Reviewed  CBC w/Diff    Lab Results   Component Value Date/Time    WBC 9.3 03/06/2020 03:40 AM    RBC 4.10 03/06/2020 03:40 AM    HGB 12.3 03/06/2020 03:40 AM    HCT 37.7 03/06/2020 03:40 AM    MCV 92.0 03/06/2020 03:40 AM    MCH 30.0 03/06/2020 03:40 AM    MCHC 32.6 03/06/2020 03:40 AM    RDW 14.5 03/06/2020 03:40 AM    PLTCT 255 03/06/2020 03:40 AM    MPV 9.2 03/06/2020 03:40 AM    No results found for: NEUT, ANC, LYMA, ALC, MONA, AMC, EOSA, AEC, BASA, ABC     Comprehensive Metabolic Profile    Lab Results   Component Value Date/Time    NA 138 08/10/2021 12:00 AM    K 4.4 08/10/2021 12:00 AM    K 3.6 08/04/2020 12:00  AM    K 4.4 03/06/2020 03:40 AM    K 3.9 03/05/2020 03:05 AM    CL 103 08/10/2021 12:00 AM    CO2 27.0 08/10/2021 12:00 AM    GAP 8 08/10/2021 12:00 AM    BUN 15.0 08/10/2021 12:00 AM    CR 0.85 08/10/2021 12:00 AM    CR 0.79 08/04/2020 12:00 AM    CR 0.68 03/06/2020 03:40 AM    CR 0.65 03/05/2020 03:05 AM    GLU 178 08/10/2021 12:00 AM    Lab Results   Component Value Date/Time    CA 8.9 08/10/2021 12:00 AM    PO4 4.2 03/06/2020 03:40 AM    ALBUMIN 3.8 08/10/2021 12:00 AM    TOTPROT 6.3 08/10/2021 12:00 AM    ALKPHOS 90 08/10/2021 12:00 AM    AST 15 08/10/2021 12:00 AM    ALT 17 08/10/2021 12:00 AM    TOTBILI 1.12 08/10/2021 12:00 AM    GFR 70.3 08/10/2021 12:00 AM    GFRAA >60 03/06/2020 03:40 AM        Cardiac Enzymes Lipids   Lab Results   Component Value Date/Time    BNP 48 08/04/2020 12:00 AM    BNP <10 04/16/2019 12:00 AM    BNP 23 03/25/2019 12:00 AM    Lab Results   Component Value Date    CHOL 132 08/10/2021    TRIG 173 08/10/2021    HDL 36 08/10/2021    LDL 62 08/10/2021    VLDL 35 08/10/2021    NONHDLCHOL 95 12/11/2017    CHOLHDLC 4 08/10/2021         Coagulation Endocrine   Lab Results   Component Value Date/Time    INR 1.1 03/04/2020 07:40 PM    Lab Results   Component Value Date/Time    HGBA1C 7.2 (H) 09/12/2018 12:00 AM    HGBA1C 6.7 (H) 02/07/2017 12:00 AM    TSH 3.47 09/21/2020 12:00 AM            Imaging/Procedures:      EKG: 03/13/2021  -Sinus bradycardia with ventricular rate of 55    Transthoracic echocardiogram: 11/27/2017   Mildly depressed left ventricular systolic function. LVEF 45-50%  Mild concentric left ventricular hypertrophy.  Mild diastolic dysfunction.   No significant valvular abnormalities.  No pericardial effusion.     Transthoracic echocardiogram: 03/25/2019   ? LVEF=60%  ? Mild Concentric LVH  ? Normal Chamber Dimensions  ? Valves Are Unremarkable  ? No Pericardial Effusion  ? Normal TAPSE=2.1  ? PASP=26mmHg    Transthoracic echocardiogram: 08/17/2020  ? The left ventricular size is normal. The left ventricular wall thickness is normal. Normal geometry. The left ventricular systolic function is normal. The ejection fraction by Simpson's biplane method is 66%. There are no segmental wall motion abnormalities. Normal left ventricular diastolic function.  ? The right ventricular size, wall thickness and systolic function are normal. The pulmonary artery pressure could not be estimated due to inadequate tricuspid regurgitation signal.  ? Structure and function of the valves is normal.  ? The aortic root and ascending aorta are normal in size.  ? No pericardial effusion.  ? Compared to study 03/25/2019, there is no significant change.    Cardiac catheterization: 12/11/2017  FINDINGS:  HEMODYNAMICS:    1. Left ventricular pressure 140/EDP 10 mmHg.  2. Aortic pressure 142/68.   ANATOMY:  1. Left main:  The left main appears to be free of any significant disease.  Arises  from the left coronary cusp.  It is normal.  It bifurcates into the left anterior descending and circumflex.  2. Left anterior descending:  The left anterior descending is a type 2 to 3 LAD.  It has mild diffuse irregularities in the proximal to midportion in the range of 30%.  It supplies a smaller 1st diagonal and has a 90% stenosis in the proximal portion.  This, however, is a very small diameter vessel.  All the other diagonals are quite small.  3. Circumflex supplies a single large obtuse marginal that has a 50% stenosis.  The circumflex itself is free of any significant disease.  Proximal to this significant obtuse marginal, there are only very small obtuse marginals.  4. Right coronary artery:  The right coronary artery is dominant, arising from the right coronary cusp.  It has 20% proximal and mid and distal irregularities.  The posterior descending has a 30% stenosis and the posterolateral has a 20% stenosis.  The right coronary artery is dominant.  CONCLUSIONS:    ? Coronary artery disease, although no severe stenoses except for very small diagonal has a 90% stenosis.  ? Normal hemodynamics.  ? Small radial artery leading to discomfort during the procedure, although not prohibitive.  If she needs repeat procedures, it may be more comfortable for her to perform this from a femoral access point.      Assessment/Plan:  Meghan Carlson is a 71 y.o. female who is here for follow-up visit.    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     Marland Kitchen Heart failure with improved ejection fraction   * Etiology - Nonischemic cardiomyopathy   * Diagnosed in 2019 - EF of 45% at that time.  Most recent TTE in May 2022 with EF of 66% and normal RV size and function  * NYHA Class I-II. Stage C. Appears euvolemic on exam.  - HF Therapy:  BB: Carvedilol 12.5 mg twice daily  ACE-I/ARB/ARNI: Losartan 50 mg daily   MRA: None  SGLT2i: Talk to endocrinology about SGLT2i vs Ozempic   CRT/ICD: N/A  - Volume:   Loop: Furosemide 20 mg as needed  Thiazide: N/A  - Labs: None    . Coronary artery disease, nonobstructive   * Cardiac cath in 12/2017 showing small diagonal with 90% stenosis, otherwisee 30% prox-mid LAD and 50% OM and 20% prox-mid RCA stenoses  . Hyperlipidemia  * Patient without anginal symptoms.  - Aspirin 81 mg daily  - Atorvastatin 80 mg daily  - Sublingual nitroglycerin as needed     . Hypertension  * Home blood pressures within goal (120s over 70s)   - See heart failure management above    . Obesity (BMI 34)   . Obstructive sleep apnea   - Compliant with CPAP  - Encouraged moderate intensity exercise    Return to clinic: in 1 year with general cardiology     Thank you for allowing me to participate in the care of this patient.  Please do not hesitate to contact me should you have any questions or concerns.     Total time spent on today's office visit was 55 minutes. This includes face-to-face in person visit with patient as well as nonface-to-face time including review of the EMR, outside records, labs, radiologic studies, echocardiogram & other cardiovascular studies, formulation of treatment plan, after visit summary,  future disposition, and lastly on documentation.    Idamae Lusher, MD  Advanced Heart Failure and Heart Transplant Cardiologist  [facility]  [facility]

## 2021-12-19 ENCOUNTER — Encounter: Admit: 2021-12-19 | Discharge: 2021-12-19 | Payer: MEDICARE

## 2021-12-22 ENCOUNTER — Encounter: Admit: 2021-12-22 | Discharge: 2021-12-22 | Payer: MEDICARE

## 2022-01-01 ENCOUNTER — Encounter: Admit: 2022-01-01 | Discharge: 2022-01-01 | Payer: MEDICARE

## 2022-01-01 ENCOUNTER — Ambulatory Visit: Admit: 2022-01-01 | Discharge: 2022-01-02 | Payer: MEDICARE

## 2022-01-01 DIAGNOSIS — E039 Hypothyroidism, unspecified: Secondary | ICD-10-CM

## 2022-01-01 DIAGNOSIS — E119 Type 2 diabetes mellitus without complications: Secondary | ICD-10-CM

## 2022-01-01 DIAGNOSIS — E7800 Pure hypercholesterolemia, unspecified: Secondary | ICD-10-CM

## 2022-01-01 DIAGNOSIS — Z780 Asymptomatic menopausal state: Secondary | ICD-10-CM

## 2022-01-01 DIAGNOSIS — I10 Essential (primary) hypertension: Secondary | ICD-10-CM

## 2022-01-01 DIAGNOSIS — F32A Depression: Secondary | ICD-10-CM

## 2022-01-01 DIAGNOSIS — E669 Obesity, unspecified: Secondary | ICD-10-CM

## 2022-01-01 DIAGNOSIS — R9439 Abnormal result of other cardiovascular function study: Secondary | ICD-10-CM

## 2022-01-01 DIAGNOSIS — E1165 Type 2 diabetes mellitus with hyperglycemia: Secondary | ICD-10-CM

## 2022-01-01 DIAGNOSIS — Q249 Congenital malformation of heart, unspecified: Secondary | ICD-10-CM

## 2022-01-01 DIAGNOSIS — I259 Chronic ischemic heart disease, unspecified: Secondary | ICD-10-CM

## 2022-01-01 DIAGNOSIS — J449 Chronic obstructive pulmonary disease, unspecified: Secondary | ICD-10-CM

## 2022-01-01 DIAGNOSIS — E785 Hyperlipidemia, unspecified: Secondary | ICD-10-CM

## 2022-01-01 DIAGNOSIS — I5042 Chronic combined systolic (congestive) and diastolic (congestive) heart failure: Secondary | ICD-10-CM

## 2022-01-01 DIAGNOSIS — I2510 Atherosclerotic heart disease of native coronary artery without angina pectoris: Secondary | ICD-10-CM

## 2022-01-01 DIAGNOSIS — G4733 Obstructive sleep apnea (adult) (pediatric): Secondary | ICD-10-CM

## 2022-01-01 MED ORDER — XULTOPHY 100/3.6 100 UNIT-3.6 MG /ML (3 ML) SC INPN
10 [IU] | Freq: Every day | SUBCUTANEOUS | 3 refills | Status: AC
Start: 2022-01-01 — End: ?

## 2022-01-01 MED ORDER — PEN NEEDLE, DIABETIC 31 GAUGE X 5/16" MISC NDLE
1 | Freq: Every day | 3 refills | 90.00000 days | Status: DC
Start: 2022-01-01 — End: 2022-01-01

## 2022-01-01 MED ORDER — PEN NEEDLE, DIABETIC 31 GAUGE X 5/16" MISC NDLE
1 | Freq: Every day | 3 refills | 90.00000 days | Status: AC
Start: 2022-01-01 — End: ?

## 2022-01-01 MED ORDER — XULTOPHY 100/3.6 100 UNIT-3.6 MG /ML (3 ML) SC INPN
10 [IU] | Freq: Every day | SUBCUTANEOUS | 3 refills | Status: DC
Start: 2022-01-01 — End: 2022-01-01

## 2022-01-01 NOTE — Telephone Encounter
Parachute orders started but cannot finish until notes are signed.

## 2022-01-01 NOTE — Telephone Encounter
-----   Message from Lamount Cohen, APRN-NP sent at 01/01/2022  3:44 PM CDT -----  Please order Libre 3 to [facility]. Now on insulin so CGM should be covered now. Thank you.

## 2022-01-02 ENCOUNTER — Encounter: Admit: 2022-01-02 | Discharge: 2022-01-02 | Payer: MEDICARE

## 2022-01-02 DIAGNOSIS — E038 Other specified hypothyroidism: Secondary | ICD-10-CM

## 2022-01-02 MED ORDER — LEVOTHYROXINE 50 MCG PO TAB
50 ug | ORAL_TABLET | Freq: Every day | ORAL | 0 refills | 30.00000 days | Status: AC
Start: 2022-01-02 — End: ?

## 2022-01-02 NOTE — Telephone Encounter
Received by fax. Refilled for 30 days only. Pt due for lab per Ashlee Orndorff's last visit notes.

## 2022-01-04 ENCOUNTER — Encounter: Admit: 2022-01-04 | Discharge: 2022-01-04 | Payer: MEDICARE

## 2022-01-04 NOTE — Telephone Encounter
Lana @ [facility] lab left 2 messages stating the patient is there waiting to get lab drawn but they do not have an order. Order faxed to [phone]. Lab order faxed. Confirmation received.

## 2022-01-08 ENCOUNTER — Encounter: Admit: 2022-01-08 | Discharge: 2022-01-08 | Payer: MEDICARE

## 2022-01-08 DIAGNOSIS — E039 Hypothyroidism, unspecified: Secondary | ICD-10-CM

## 2022-01-22 ENCOUNTER — Encounter: Admit: 2022-01-22 | Discharge: 2022-01-22 | Payer: MEDICARE

## 2022-01-22 NOTE — Telephone Encounter
Received call from patient  Patient asking to be seen in person  Appointment changed per patient request  Closing encounter

## 2022-02-03 ENCOUNTER — Encounter: Admit: 2022-02-03 | Discharge: 2022-02-03 | Payer: MEDICARE

## 2022-02-03 DIAGNOSIS — E038 Other specified hypothyroidism: Secondary | ICD-10-CM

## 2022-02-03 MED ORDER — LEVOTHYROXINE 50 MCG PO TAB
50 ug | ORAL_TABLET | Freq: Every day | ORAL | 1 refills
Start: 2022-02-03 — End: ?

## 2022-02-08 ENCOUNTER — Ambulatory Visit: Admit: 2022-02-08 | Discharge: 2022-02-09 | Payer: MEDICARE

## 2022-02-08 ENCOUNTER — Encounter: Admit: 2022-02-08 | Discharge: 2022-02-08 | Payer: MEDICARE

## 2022-02-08 NOTE — Patient Instructions
It was a pleasure seeing you today, Meghan Carlson.    Below are the topics that we discussed during our visit.    Eat 3 meals per day with 30-45 gm of carbohydrate per meal.     Have balanced meals to include a source of protein and vegetable.    Use food labels and fast food information to count carbohydrates.    Move and be active after meals.    Please do not hesitate to call or send a MyChart message with any questions or concerns.     Take care,    Theodoro Parma, RD, LD, [place]  Registered Dietitian, Diabetes Educator  [place] [facility] [place]    For appointments: [phone]  Endo on call: [phone] after hours

## 2022-05-21 ENCOUNTER — Encounter: Admit: 2022-05-21 | Discharge: 2022-05-21 | Payer: MEDICARE

## 2022-05-24 ENCOUNTER — Encounter: Admit: 2022-05-24 | Discharge: 2022-05-24 | Payer: MEDICARE

## 2022-06-07 ENCOUNTER — Encounter: Admit: 2022-06-07 | Discharge: 2022-06-07 | Payer: MEDICARE

## 2022-06-07 ENCOUNTER — Ambulatory Visit: Admit: 2022-06-07 | Discharge: 2022-06-08 | Payer: MEDICARE

## 2022-06-07 MED ORDER — ATORVASTATIN 80 MG PO TAB
80 mg | ORAL_TABLET | Freq: Every day | ORAL | 3 refills | Status: AC
Start: 2022-06-07 — End: ?

## 2022-06-11 ENCOUNTER — Encounter: Admit: 2022-06-11 | Discharge: 2022-06-11 | Payer: MEDICARE

## 2022-06-20 ENCOUNTER — Encounter: Admit: 2022-06-20 | Discharge: 2022-06-20 | Payer: MEDICARE

## 2022-06-20 ENCOUNTER — Ambulatory Visit: Admit: 2022-06-20 | Discharge: 2022-06-21 | Payer: MEDICARE

## 2022-06-20 DIAGNOSIS — F32A Depression: Secondary | ICD-10-CM

## 2022-06-20 DIAGNOSIS — J449 Chronic obstructive pulmonary disease, unspecified: Secondary | ICD-10-CM

## 2022-06-20 DIAGNOSIS — I2510 Atherosclerotic heart disease of native coronary artery without angina pectoris: Secondary | ICD-10-CM

## 2022-06-20 DIAGNOSIS — I259 Chronic ischemic heart disease, unspecified: Secondary | ICD-10-CM

## 2022-06-20 DIAGNOSIS — E669 Obesity, unspecified: Secondary | ICD-10-CM

## 2022-06-20 DIAGNOSIS — Z780 Asymptomatic menopausal state: Secondary | ICD-10-CM

## 2022-06-20 DIAGNOSIS — E119 Type 2 diabetes mellitus without complications: Secondary | ICD-10-CM

## 2022-06-20 DIAGNOSIS — R9439 Abnormal result of other cardiovascular function study: Secondary | ICD-10-CM

## 2022-06-20 DIAGNOSIS — I10 Essential (primary) hypertension: Secondary | ICD-10-CM

## 2022-06-20 DIAGNOSIS — G4733 Obstructive sleep apnea (adult) (pediatric): Secondary | ICD-10-CM

## 2022-06-20 DIAGNOSIS — E039 Hypothyroidism, unspecified: Secondary | ICD-10-CM

## 2022-06-20 DIAGNOSIS — E785 Hyperlipidemia, unspecified: Secondary | ICD-10-CM

## 2022-06-20 DIAGNOSIS — E7800 Pure hypercholesterolemia, unspecified: Secondary | ICD-10-CM

## 2022-06-20 DIAGNOSIS — Q249 Congenital malformation of heart, unspecified: Secondary | ICD-10-CM

## 2022-06-20 MED ORDER — GLIPIZIDE 2.5 MG PO TR24
2.5 mg | ORAL_TABLET | Freq: Two times a day (BID) | ORAL | 3 refills | Status: AC
Start: 2022-06-20 — End: ?

## 2022-06-20 NOTE — Progress Notes
Date of Service: 06/20/2022    Subjective:             Meghan Carlson is a 72 y.o. female. here for DM and hypothyroidism follow up.     History of Present Illness  Meghan Carlson is a 72 y.o. female with past medical history of diabetes mellitus, hypothyroidism, pituitary gland surgery, diastolic heart failure is here for follow up.     Pt last saw me in Sept 2023.   Is seeing Danne Harbor, RD as well       Interval history    Started Xultophy last appt due to Ozempic being too expensive. Is tolerating well.   Has not had any weight loss.   No new health issues.   Started Libre 3- liking. No hypoglycemia.      Diabetes Mellitus Type  2   Dx:  2011    A1c  8% today  Was 9.3% in Sept 2023; 7.6% previously in February 2023  POC glucose 404 today    Current regimen:   glipizide 2.5 mg daily at lunch   Xultophy 14-15 units daily      Previously tried:   Invokana (stopped due to recurrent UTI)   Metformin 1G BID (diarhea)     She consumes two meals a day: lunch 1100-1200 and dinner 7-8 pm  POC checks: morning usually, 200s-240s up to 350s during the day ,drink ice tea, pop corn occasionally before going to bed; has stopped drinking soda pop      Complication:  Retinopathy/Vision:  eye exam on 11/23/20. Will schedule follow up in near future.   Peripheral neuropathy/Feet: no  Autonomic neuropathy: no  Nephropathy: no  Macrovascular complications: No     No History of pancreatitis. No history of thyroid cancer in family.     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   Hormonal workup done on 12/2019 did not show cortisol or ACTH deficiency.   Denies any s/s adrenal insufficiency--no fatigue, dizziness, orthostatics      Hypercalciuria  H/O  2 episodes of kidney stone.  last in 2020 per patient. None since last visit.   Right wrist fracture with mild trauma in 1990 after trauma  Was on IM steroid injection for 2 years.  No family history of osteoporosis or osteopenia or parathyroid disease    She is not taking calcium tablets   Taking 1-2 serving of dairy products    08/04/2020 00:00   Sodium, 24 HR 66   Sodium, Random 66   Creatinine, Random 52.64   Microalbumin, Random <5.0   Calcium 24 Hr 341.3 (A)   Creatinine, Ur 31.05 (A)   Urine Volume 3,750   Creatinine, 24 HR Urine 1.2   Microalbumin/CR ratio Urine *      Vitamin D deficiency    12/24/2019  08/04/2020   Vitamin D(25-OH)Total 19 (A) 31   Takes Vitamin D daily, doesn't know the dose      Osteopenia  Bone density done on 04/2020  showed osteopenia with low FRAX score (although very close to osteoporosis). No falls or fractures since last visit.        Review of Systems   Constitutional:  Negative for fatigue and unexpected weight change.   Eyes:  Negative for visual disturbance.   Respiratory:  Positive for cough and shortness of breath. Negative for chest tightness, wheezing and stridor.    Cardiovascular:  Negative for chest pain, palpitations and leg swelling.   Gastrointestinal:  Negative for constipation, diarrhea, nausea and vomiting.   Endocrine: Negative for polydipsia, polyphagia and polyuria.   Genitourinary:  Negative for dysuria, enuresis and frequency.   Neurological:  Negative for dizziness, light-headedness and headaches.         Objective:          albuterol sulfate (PROAIR HFA) 90 mcg/actuation HFA aerosol inhaler Inhale two puffs by mouth into the lungs every 6 hours as needed for Wheezing or Shortness of Breath. Shake well before use.    aspirin (ASPIRIN CHILDRENS) 81 mg chewable tablet Chew one tablet by mouth daily. Take with food.    atorvastatin (LIPITOR) 80 mg tablet TAKE 1 TABLET BY MOUTH DAILY    carvediloL (COREG) 12.5 mg tablet Take one tablet by mouth twice daily with meals. Take with food.    diclofenac sodium (VOLTAREN-XR) 100 mg xr tablet Take one tablet by mouth daily. Take with food.    escitalopram oxalate (LEXAPRO) 20 mg tablet Take one tablet by mouth daily.    fluticasone propionate (FLONASE) 50 mcg/actuation nasal spray Apply  to each nostril as directed as Needed. Shake bottle gently before using.     furosemide (LASIX) 20 mg tablet TAKE 2 TABLETS BY MOUTH IN THE MORNING AS NEEDED FOR SWELLING OR WEIGHT GAIN (Patient taking differently: Take one tablet by mouth every 48 hours.)    glipiZIDE (GLUCOTROL) 5 mg tablet Take one tablet by mouth daily with lunch. Indications: type 2 diabetes mellitus (Patient taking differently: Take one-half tablet by mouth daily with lunch. Indications: type 2 diabetes mellitus)    insulin degludec-liraglutide (XULTOPHY 100/3.6) 100 unit-3.6 mg /mL (3 mL) injection PEN Inject 0.1 mL under the skin daily. Increase by 1 unit every 3 days if fasting blood sugars are over 130 mg/dl. MAX DAILY 22 units daily.    insulin pen needles (disposable) (COMFORT EZ PEN NEEDLES) 31 gauge x 5/16 pen needle Use one each as directed daily.    levothyroxine (SYNTHROID) 50 mcg tablet TAKE ONE TABLET BY MOUTH DAILY 30 MINUTES BEFORE BREAKFAST.    losartan (COZAAR) 50 mg tablet Take one tablet by mouth daily.    nitroglycerin (NITROSTAT) 0.4 mg tablet Place 1 tab under tongue every 5 minutes as needed for Chest Pain (Not to exceed 3 doses /15 min. If pain persists , seek medical attention    oxycodone(+) (ROXICODONE, OXY-IR) 20 mg tablet Take one tablet by mouth every 6 hours as needed.    triamcinolone acetonide (KENALOG) 0.1 % topical cream Apply  topically to affected area as Needed.    valACYclovir (VALTREX) 500 mg tablet Take one tablet by mouth every 24 hours.     Vitals:    06/20/22 1051   BP: 108/57   BP Source: Arm, Right Upper   Pulse: 69 Temp: 36.7 ?C (98 ?F)   PainSc: Zero   Weight: 86.2 kg (190 lb)   Height: 160 cm (5' 3)     Body mass index is 33.66 kg/m?Marland Kitchen     Physical Exam  Constitutional:       Appearance: Normal appearance. She is obese.   Eyes:      Extraocular Movements: Extraocular movements intact.      Conjunctiva/sclera: Conjunctivae normal.      Pupils: Pupils are equal, round, and reactive to light.   Cardiovascular:      Rate and Rhythm: Normal rate and regular rhythm.      Pulses: Normal pulses.      Heart sounds: Normal heart sounds.   Pulmonary:      Effort: Pulmonary effort is normal.      Breath sounds: Normal breath sounds.   Skin:     General: Skin is warm and dry.   Neurological:      Mental Status: She is alert and oriented to person, place, and time.   Psychiatric:         Mood and Affect: Mood normal.         Behavior: Behavior normal.         Thought Content: Thought content normal.         Judgment: Judgment normal.       Diabetic Foot Exam       Bilateral vascular, sensation, integument are normal:  Yes      Continuous Glucose Monitoring Analysis and Interpretation    Indication for Device Placement: Type 2 Insulin Dependent Diabetic, and frequent hypoglycemia (50mg ) episodes    Name/Type of Device Placed: FreeStyle Libre    Continuous glucose monitor downloaded and personally reviewed by me on 06/20/22.    Analysis of Data    Date Range: 14 days        Assessment  Trends/Patterns observed  Hyperglycemia: post meals, overnight  Hypoglycemia: none observed        Assessment and Plan:  Diabetes mellitus type 2  glipizide 2.5 mg daily   A1c 9.3%, ideally goal <7.5% due to comorbidities without hypoglycemia   Ozempic was cost prohibitive. SGLTi discontinued due to UTI's.  -she initially reported slight improvement in diarrhea after stopping metformin but pt reports she is still having diarrhea    Plan:   -increase glipizide to 2.5 mg BID -- will resume every day dosing if having lows.   -Increase Xultophy to 17 units once daily. Increase by 1 unit every 3 days if morning fasting glucose >130.   - continue Libre 3   -she is not willing to restart metformin at this time   -would also benefit from [facility] but did not tolerate in the past due to recurrent UTIs   -can consider Jardiance once blood sugars improve slightly to reduce risk of dehydration and UTI   - seeing RD   -continue lifestyle modifications    -F/U WITH CDC[facility]     Diabetes mellitus annual monitoring  Urine microalbumin:  No microalbuminuria on 08/10/2021 -- repeat this year  Lipid panel: 08/10/2021 shows LDL within goal -- repeat this year   Blood chemistry: last done on 08/10/2021 with normal creatinine-- repeat this year  Eye examination: recommended to make new appointment  Foot examination: done today      Hypothyroidism  Currently on levothyroxine 50 mcg daily.  Last TFT's in range  Repeat now , ordered     TSH   Lab Results   Component Value Date/Time    TSH 3.47 09/21/2020 12:00 AM        Osteopenia  Bone density done on 04/2020 showed osteopenia with low FRAX score (although very close to osteoporosis).  Repeat  Bone density in 2 years or earlier if clinically indicated (BMD due January 2024)  Plan:   Continue with vitamin D supplementation--adequate calcium in diet   Repeat DEXA. Ordered. Pt will complete at diagnostic imaging, does not want to go down to main hospital.       Vitamin D deficiency  -Recent vitamin D is normal.  Continue with vitamin D supplementation.   -repeat labs now.       Hypercalciuria  24-hour urine calcium on 07/27/2020 showed hypercalciuria.  This was done while she was on Lasix.  Last episode of kidney stone was in 2020 per patient report  Recommend to have 2-3 servings of dietary calcium and not to take any calcium tablets or Tums orally  Advised to contact us if she develops any new kidney stones     Obesity, class 1:   -BMI 33, down from 34.01; counseled pt on lifestyle modifications including dietary choices and increased activity as tolerated    Chronic combined systolic HF   Managed by cardiology  Would benefit from SGLT2i- but did not tolerate Invokana in past due to recurrent UTI  Can consider trialing Jardiance once BG improve some to prevent dehydration   Cost may be barrier     Meghan Nettleton R Sanad Fearnow, APRN-NP

## 2022-06-21 DIAGNOSIS — E1165 Type 2 diabetes mellitus with hyperglycemia: Secondary | ICD-10-CM

## 2022-06-21 DIAGNOSIS — M8589 Other specified disorders of bone density and structure, multiple sites: Secondary | ICD-10-CM

## 2022-07-03 ENCOUNTER — Encounter: Admit: 2022-07-03 | Discharge: 2022-07-03 | Payer: MEDICARE

## 2022-07-03 NOTE — Telephone Encounter
Patient left a VM stating Ashlee wanted her to get a bone density in [place] closer to home. She has not heard from [facility] about where to get it done. Reviewed visit notes:    Osteopenia  Bone density done on 04/2020 showed osteopenia with low FRAX score (although very close to osteoporosis).  Repeat Bone density in 2 years or earlier if clinically indicated (BMD due January 2024)  Plan:   Continue with vitamin D supplementation--adequate calcium in diet   Repeat DEXA. Ordered. Pt will complete at diagnostic imaging, does not want to go down to main hospital.    Diagnostic Imaging found in [place], [place]:    [facility].  [address], [place], [address]  [phone]  Fax [phone]    Dexa order printed and faxed.    Notified patient. Gave her the phone number and address so she can call and get scheduled. No further questions.

## 2022-07-12 ENCOUNTER — Encounter: Admit: 2022-07-12 | Discharge: 2022-07-12 | Payer: MEDICARE

## 2022-09-17 ENCOUNTER — Encounter: Admit: 2022-09-17 | Discharge: 2022-09-17 | Payer: MEDICARE

## 2022-09-17 ENCOUNTER — Ambulatory Visit: Admit: 2022-09-17 | Discharge: 2022-09-18 | Payer: MEDICARE

## 2022-09-17 DIAGNOSIS — E1165 Type 2 diabetes mellitus with hyperglycemia: Secondary | ICD-10-CM

## 2022-09-17 NOTE — Progress Notes
Diabetes Self-Management Education and Support [facility])  Received referral on 01/01/22 from Chong Sicilian, APRN-NP for Diabetic Education   Referral diagnosis: Type 2 diabetes mellitus with hyperglycemia, without long-term current use of insulin.    Assessment of Participant  Diabetes Pathophysiology and Treatment Options  HgA1c HISTORY:  Lab Results   Component Value Date    HGBA1C 7.2 (H) 09/12/2018    HGBA1C 6.7 (H) 02/07/2017     Lab Results   Component Value Date    A1C 8.0 (A) 06/20/2022    A1C 9.3 (A) 01/01/2022    A1C 7.6 (A) 05/15/2021     Meal Planning  Diet Recall:  Lunch: ham sandwich or burrito or [employer] McChicken sandwich with half large order of fries  Dinner: hamburger, pasta salad, chips or lasagna or Enchilada, or pork chops, green beans, potatoes  Snack: sometimes popcorn  Pt reports drinking water and diet Dr. Reino Kent    Being Active  No routine    Taking Medications  Review of diabetes medications that patient reports currently taking:  Glipizide 2.5 mg before lunch  Xultophy 16-17 units daily in the morning     Monitoring Glucose      Summary of Education Intervention/Comments  Pt is here today with her husband for follow-up for diabetes education. Reviewed with pt carbohydrate counting and importance of counting carbohydrate for blood sugar control. Encouraged balanced meals to include a source of protein.     Handouts provided: Simple Carbohydrate Counting    Pt seen individually for follow-up to complete initial diabetes education.    Education intervention and content taught provided by Bary Castilla, RD on 09/17/2022.    Education Intervention/Learning Outcomes  Diabetes Pathophysiology Content Taught/Learning Outcomes  Three options for treating diabetes: meal planning, exercise, medications: 3 - Comprehends key points  Healthy Eating Content Taught/Learning Outcomes  Effect of type, amount and timing of food on blood glucose: 3 - Comprehends key points  Three methods of planning meals: 3 - Comprehends key points     Taking Medications Content Taught/Learning Outcomes  Effect of diabetes medications on diabetes: 3 - Comprehends key points  Name of diabetes medication, taking, action and side effects: 3 - Comprehends key points                             Behavioral Goal  Behavioral Outcomes  Behavioral Goal Area: Being active  Behavioral Goal: Exercise with leg exercises every other day.  Behavior Goal Follow-up: 0% (Never)    Clinical Outcome A1C  Clinical Outcome A1C  Baseline A1C: 9.3 %    DSMES Communicated with Referring Provider  Referring provider has shared EMR and has access to view education plan, education intervention, learning outcomes, behavioral outcomes, and clinical outcomes.    Follow-up: 01/21/23 Diabetes Education  12/12/22 Perley Jain, APRN     Start Time: 10:30 am  Stop Time: 11:00 am

## 2022-09-20 ENCOUNTER — Encounter: Admit: 2022-09-20 | Discharge: 2022-09-20 | Payer: MEDICARE

## 2022-10-02 ENCOUNTER — Encounter: Admit: 2022-10-02 | Discharge: 2022-10-02 | Payer: MEDICARE

## 2022-10-02 DIAGNOSIS — E1165 Type 2 diabetes mellitus with hyperglycemia: Secondary | ICD-10-CM

## 2022-10-02 LAB — COMPREHENSIVE METABOLIC PANEL
ALBUMIN: 3.7 g/dL (ref 3.4–4.8)
ALK PHOSPHATASE: 107 U/L (ref 40–150)
ALT: 17 U/L (ref 0–55)
ANION GAP: 10 meq/L (ref 8–16)
AST: 18 U/L (ref 5–34)
BLD UREA NITROGEN: 10 mg/dL (ref 9.8–20.1)
CALCIUM: 105 mmol/L (ref 98–107)
CHLORIDE: 105 mmol/L (ref 98–107)
CO2: 23 mmol/L (ref 23–31)
CREATININE: 0.8 mg/dL (ref 0.57–1.11)
GFR ESTIMATED: 74 mL/min/1.73 (ref >59)
GLUCOSE,PANEL: 173 mg/dL (ref 70–105)
POTASSIUM: 4.2 mmol/L (ref 3.5–5.1)
SODIUM: 138 mmol/L (ref 136–145)
TOTAL BILIRUBIN: 1.1 mg/dL (ref 0.2–1.2)
TOTAL PROTEIN: 6.4 g/dL (ref 6.2–8.1)

## 2022-10-02 LAB — LIPID PROFILE
CHOLESTEROL/HDL %: 4 (ref 0–5)
CHOLESTEROL: 133 mg/dL (ref <200)
HDL: 32 mg/dL (ref >=40)
LDL: 77 mg/dL (ref <100)
TRIGLYCERIDES: 124 mg/dL (ref <150)
VLDL: 25 mg/dL (ref 5–40)

## 2022-10-02 LAB — MICROALB/CR RATIO-URINE RANDOM
MICROALBUMIN, RAN: 122 mg/dL (ref 47–110)
MICROALBUMIN/CR RATIO URINE: 10 mg/dL
UR CREATININE, RAN: 8.1 (ref <30.0)

## 2022-10-02 LAB — PLATELET COUNT: PLATELET COUNT: 273 x10-3/uL (ref 182–369)

## 2022-10-05 ENCOUNTER — Encounter: Admit: 2022-10-05 | Discharge: 2022-10-05 | Payer: MEDICARE

## 2022-10-05 DIAGNOSIS — M8589 Other specified disorders of bone density and structure, multiple sites: Secondary | ICD-10-CM

## 2022-12-12 ENCOUNTER — Encounter: Admit: 2022-12-12 | Discharge: 2022-12-12 | Payer: MEDICARE

## 2022-12-12 ENCOUNTER — Ambulatory Visit: Admit: 2022-12-12 | Discharge: 2022-12-13 | Payer: MEDICARE

## 2022-12-12 DIAGNOSIS — Z780 Asymptomatic menopausal state: Secondary | ICD-10-CM

## 2022-12-12 DIAGNOSIS — E785 Hyperlipidemia, unspecified: Secondary | ICD-10-CM

## 2022-12-12 DIAGNOSIS — F32A Depression: Secondary | ICD-10-CM

## 2022-12-12 DIAGNOSIS — I10 Essential (primary) hypertension: Secondary | ICD-10-CM

## 2022-12-12 DIAGNOSIS — E1169 Type 2 diabetes mellitus with other specified complication: Secondary | ICD-10-CM

## 2022-12-12 DIAGNOSIS — E119 Type 2 diabetes mellitus without complications: Secondary | ICD-10-CM

## 2022-12-12 DIAGNOSIS — E039 Hypothyroidism, unspecified: Secondary | ICD-10-CM

## 2022-12-12 DIAGNOSIS — E1165 Type 2 diabetes mellitus with hyperglycemia: Secondary | ICD-10-CM

## 2022-12-12 DIAGNOSIS — I259 Chronic ischemic heart disease, unspecified: Secondary | ICD-10-CM

## 2022-12-12 DIAGNOSIS — G4733 Obstructive sleep apnea (adult) (pediatric): Secondary | ICD-10-CM

## 2022-12-12 DIAGNOSIS — J449 Chronic obstructive pulmonary disease, unspecified: Secondary | ICD-10-CM

## 2022-12-12 DIAGNOSIS — E669 Obesity, unspecified: Secondary | ICD-10-CM

## 2022-12-12 DIAGNOSIS — I2510 Atherosclerotic heart disease of native coronary artery without angina pectoris: Secondary | ICD-10-CM

## 2022-12-12 DIAGNOSIS — R9439 Abnormal result of other cardiovascular function study: Secondary | ICD-10-CM

## 2022-12-12 DIAGNOSIS — Q249 Congenital malformation of heart, unspecified: Secondary | ICD-10-CM

## 2022-12-12 DIAGNOSIS — E7800 Pure hypercholesterolemia, unspecified: Secondary | ICD-10-CM

## 2022-12-12 MED ORDER — GLIPIZIDE 2.5 MG PO TR24
2.5 mg | ORAL_TABLET | Freq: Two times a day (BID) | ORAL | 3 refills | Status: AC
Start: 2022-12-12 — End: ?

## 2022-12-12 MED ORDER — LEVOTHYROXINE 50 MCG PO TAB
50 ug | ORAL_TABLET | Freq: Every day | ORAL | 1 refills | 30.00000 days | Status: AC
Start: 2022-12-12 — End: ?

## 2022-12-12 MED ORDER — XULTOPHY 100/3.6 100 UNIT-3.6 MG /ML (3 ML) SC INPN
20 [IU] | Freq: Every day | SUBCUTANEOUS | 3 refills | Status: AC
Start: 2022-12-12 — End: ?

## 2022-12-13 DIAGNOSIS — E063 Autoimmune thyroiditis: Secondary | ICD-10-CM

## 2022-12-13 DIAGNOSIS — E038 Other specified hypothyroidism: Secondary | ICD-10-CM

## 2022-12-21 ENCOUNTER — Encounter: Admit: 2022-12-21 | Discharge: 2022-12-21 | Payer: MEDICARE

## 2022-12-21 DIAGNOSIS — F32A Depression: Secondary | ICD-10-CM

## 2022-12-21 DIAGNOSIS — R9439 Abnormal result of other cardiovascular function study: Secondary | ICD-10-CM

## 2022-12-21 DIAGNOSIS — E669 Obesity, unspecified: Secondary | ICD-10-CM

## 2022-12-21 DIAGNOSIS — I259 Chronic ischemic heart disease, unspecified: Secondary | ICD-10-CM

## 2022-12-21 DIAGNOSIS — E119 Type 2 diabetes mellitus without complications: Secondary | ICD-10-CM

## 2022-12-21 DIAGNOSIS — E039 Hypothyroidism, unspecified: Secondary | ICD-10-CM

## 2022-12-21 DIAGNOSIS — E7800 Pure hypercholesterolemia, unspecified: Secondary | ICD-10-CM

## 2022-12-21 DIAGNOSIS — I10 Essential (primary) hypertension: Secondary | ICD-10-CM

## 2022-12-21 DIAGNOSIS — Z780 Asymptomatic menopausal state: Secondary | ICD-10-CM

## 2022-12-21 DIAGNOSIS — Q249 Congenital malformation of heart, unspecified: Secondary | ICD-10-CM

## 2022-12-21 DIAGNOSIS — I2510 Atherosclerotic heart disease of native coronary artery without angina pectoris: Secondary | ICD-10-CM

## 2022-12-21 DIAGNOSIS — E785 Hyperlipidemia, unspecified: Secondary | ICD-10-CM

## 2022-12-21 DIAGNOSIS — G4733 Obstructive sleep apnea (adult) (pediatric): Secondary | ICD-10-CM

## 2022-12-21 DIAGNOSIS — J449 Chronic obstructive pulmonary disease, unspecified: Secondary | ICD-10-CM

## 2023-01-17 ENCOUNTER — Encounter: Admit: 2023-01-17 | Discharge: 2023-01-17 | Payer: MEDICARE

## 2023-01-17 ENCOUNTER — Ambulatory Visit: Admit: 2023-01-17 | Discharge: 2023-01-18 | Payer: MEDICARE

## 2023-01-17 DIAGNOSIS — E1165 Type 2 diabetes mellitus with hyperglycemia: Secondary | ICD-10-CM

## 2023-01-21 ENCOUNTER — Encounter: Admit: 2023-01-21 | Discharge: 2023-01-21 | Payer: MEDICARE

## 2023-01-23 ENCOUNTER — Encounter: Admit: 2023-01-23 | Discharge: 2023-01-23 | Payer: MEDICARE

## 2023-01-23 DIAGNOSIS — E7800 Pure hypercholesterolemia, unspecified: Secondary | ICD-10-CM

## 2023-01-23 DIAGNOSIS — F32A Depression: Secondary | ICD-10-CM

## 2023-01-23 DIAGNOSIS — I10 Essential (primary) hypertension: Secondary | ICD-10-CM

## 2023-01-23 DIAGNOSIS — E039 Hypothyroidism, unspecified: Secondary | ICD-10-CM

## 2023-01-23 DIAGNOSIS — Z780 Asymptomatic menopausal state: Secondary | ICD-10-CM

## 2023-01-23 DIAGNOSIS — E785 Hyperlipidemia, unspecified: Secondary | ICD-10-CM

## 2023-01-23 DIAGNOSIS — E1169 Type 2 diabetes mellitus with other specified complication: Secondary | ICD-10-CM

## 2023-01-23 DIAGNOSIS — Q249 Congenital malformation of heart, unspecified: Secondary | ICD-10-CM

## 2023-01-23 DIAGNOSIS — I5042 Chronic combined systolic (congestive) and diastolic (congestive) heart failure: Secondary | ICD-10-CM

## 2023-01-23 DIAGNOSIS — R0609 Other forms of dyspnea: Secondary | ICD-10-CM

## 2023-01-23 DIAGNOSIS — I259 Chronic ischemic heart disease, unspecified: Secondary | ICD-10-CM

## 2023-01-23 DIAGNOSIS — R9439 Abnormal result of other cardiovascular function study: Secondary | ICD-10-CM

## 2023-01-23 DIAGNOSIS — E119 Type 2 diabetes mellitus without complications: Secondary | ICD-10-CM

## 2023-01-23 DIAGNOSIS — J449 Chronic obstructive pulmonary disease, unspecified: Secondary | ICD-10-CM

## 2023-01-23 DIAGNOSIS — I2510 Atherosclerotic heart disease of native coronary artery without angina pectoris: Secondary | ICD-10-CM

## 2023-01-23 DIAGNOSIS — E669 Obesity, unspecified: Secondary | ICD-10-CM

## 2023-01-23 DIAGNOSIS — G4733 Obstructive sleep apnea (adult) (pediatric): Secondary | ICD-10-CM

## 2023-01-23 DIAGNOSIS — Z136 Encounter for screening for cardiovascular disorders: Secondary | ICD-10-CM

## 2023-01-23 MED ORDER — EZETIMIBE 10 MG PO TAB
10 mg | ORAL_TABLET | Freq: Every day | ORAL | 3 refills | Status: AC
Start: 2023-01-23 — End: ?

## 2023-01-23 NOTE — Progress Notes
Date of Service: 01/23/2023      Patient: Meghan Carlson  ZOX:[id number]  DOB: 01-25-1951  Her PCP is Burnadette Pop.      Problem List:  Patient Active Problem List    Diagnosis Date Noted    Chronic combined systolic and diastolic heart failure (HCC) 03/13/2021    Lactic acidosis 03/07/2020    Subdural hematoma (HCC) 03/04/2020    Coronary artery disease involving native coronary artery of native heart without angina pectoris 08/25/2018    Carotid artery calcification 03/18/2018    Abnormal stress test 12/11/2017    DOE (dyspnea on exertion) 11/28/2017    Hypothyroidism 11/11/2017    Dyslipidemia 11/11/2017    Essential hypertension 11/11/2017    Obesity 11/11/2017    Diabetes mellitus (HCC) 11/11/2017    OSA (obstructive sleep apnea) 11/11/2017         Subjective:  Meghan Carlson is a 72 y.o. female presents to the advanced heart failure clinic for Follow-up visit.     Patient previously managed by Dr. Pierre Bali and established care with me in 03/2021.     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     Today the patient presents to the clinic visit alone.   She continues to have stable dyspnea while walking up hills or stairs.   She denies chest pain, chest pressure/discomfort, palpitations, irregular heart beats, near-syncope, syncope, fatigue, orthopnea, paroxysmal nocturnal dyspnea, lower extremity edema.   She takes furosemide 20 mg about once a day or every other day when she notices lower extremity swelling or dyspnea with exertion..   Current exercise: no regular exercise - thinking about getting L knee surgery to help with mobility   She states she is compliant with medications.   Home BP readings are around 120/60s  Home weight has been stable around 188 -191 pounds.  Patient's endocrinologist looked into the cost of SGLT2i and GLP-1 agonists but they were too expensive. She is taking glipizide and xultophy.       Medications:  Current Outpatient Medications   Medication Instructions    albuterol sulfate (PROAIR HFA) 90 mcg/actuation HFA aerosol inhaler 2 puffs, Inhalation, EVERY  6 HOURS PRN, Shake well before use.     aspirin (ASPIRIN CHILDRENS) 81 mg, Oral, DAILY, Take with food.    atorvastatin (LIPITOR) 80 mg, Oral, DAILY    carvediloL (COREG) 12.5 mg, Oral, TWICE DAILY WITH MEALS, Take with food.    diclofenac sodium (VOLTAREN-XR) 100 mg, Oral, DAILY, Take with food.     escitalopram oxalate (LEXAPRO) 20 mg, Oral, DAILY    fluticasone propionate (FLONASE) 50 mcg/actuation nasal spray Each Nostril, AS NEEDED, Shake bottle gently before using.    furosemide (LASIX) 20 mg tablet TAKE 2 TABLETS BY MOUTH IN THE MORNING AS NEEDED FOR SWELLING OR WEIGHT GAIN    glipiZIDE CR (GLUCOTROL XL) 2.5 mg, Oral, TWICE DAILY WITH MEALS    HYDROcodone/acetaminophen (NORCO) 10/325 mg tablet 1 tablet, Oral, EVERY  6 HOURS PRN    insulin degludec-liraglutide (XULTOPHY 100/3.6) 100 unit-3.6 mg /mL (3 mL) injection PEN 20 Units, Subcutaneous, DAILY, Increase by 1 unit every 3 days if fasting blood sugars are over 130 mg/dl. MAX DAILY 26 units daily.    insulin pen needles (disposable) (COMFORT EZ PEN NEEDLES) 31 gauge x 5/16 pen needle 1 each, Misc.(Non-Drug; Combo Route), DAILY  levothyroxine (SYNTHROID) 50 mcg, Oral, DAILY BEFORE BREAKFAST    losartan (COZAAR) 50 mg, Oral, DAILY    nitroglycerin (NITROSTAT) 0.4 mg tablet Place 1 tab under tongue every 5 minutes as needed for Chest Pain (Not to exceed 3 doses /15 min. If pain persists , seek medical attention    oxyCODONE 20 mg, Oral, EVERY  6 HOURS PRN    triamcinolone acetonide (KENALOG) 0.1 % topical cream Topical, AS NEEDED    valACYclovir (VALTREX) 500 mg, Oral, EVERY 24 HOURS        Allergies:   Allergies   Allergen Reactions    Peanut SWOLLEN TONGUE    Lisinopril COUGH Seasonal Allergies RHINITIS    Sulfonamide [Sulfa (Sulfonamide Antibiotics)] ITCHING       Objective:  BP 116/64 (BP Source: Arm, Left Upper, Patient Position: Sitting)  - Pulse 64  - Ht 160 cm (5' 3)  - Wt 86.5 kg (190 lb 12.8 oz)  - SpO2 94%  - BMI 33.80 kg/m?   BP Readings from Last 3 Encounters:   01/23/23 116/64   12/12/22 106/64   06/20/22 108/57      Pulse Readings from Last 3 Encounters:   01/23/23 64   12/12/22 59   06/20/22 69     Wt Readings from Last 8 Encounters:   01/23/23 86.5 kg (190 lb 12.8 oz)   12/12/22 87.1 kg (192 lb)   06/20/22 86.2 kg (190 lb)   12/18/21 87.1 kg (192 lb)   08/07/21 86.2 kg (190 lb)   05/15/21 85.4 kg (188 lb 3.2 oz)   03/13/21 85.3 kg (188 lb)   11/22/20 86.5 kg (190 lb 12.8 oz)       Constitutional: She appears well-developed and well-nourished.   HENT:  Head: Normocephalic.    Eyes: Conjunctivae are normal.   Neck: Normal range of motion. CVP of 7.  Cardiovascular: Normal rate, regular rhythm, normal heart sounds and intact distal pulses. No murmur.  Pulmonary/Chest: Effort normal and breath sounds normal. No respiratory distress. She has no wheezes or rales.   Abdominal: Soft. Bowel sounds are normal. She exhibits no distension.   Musculoskeletal: Normal range of motion and muscular tone. She exhibits no edema.   Neurological: She is alert and oriented to person, place, and time. No focal deficits.  Skin: Skin is warm. No erythema.   Psychiatric: She has a normal mood and affect. Judgment, behavior, and thought content normal.       Labs: Reviewed  CBC w/Diff    Lab Results   Component Value Date/Time    WBC 9.3 03/06/2020 03:40 AM    RBC 4.10 03/06/2020 03:40 AM    HGB 12.3 03/06/2020 03:40 AM    HCT 37.7 03/06/2020 03:40 AM    MCV 92.0 03/06/2020 03:40 AM    MCH 30.0 03/06/2020 03:40 AM    MCHC 32.6 03/06/2020 03:40 AM    RDW 14.5 03/06/2020 03:40 AM    PLTCT 273 10/02/2022 12:00 AM    MPV 9.2 03/06/2020 03:40 AM    No results found for: NEUT, ANC, LYMA, ALC, MONA, AMC, EOSA, AEC, BASA, ABC     Comprehensive Metabolic Profile    Lab Results   Component Value Date/Time    NA 138 10/02/2022 12:00 AM    K 4.2 10/02/2022 12:00 AM    K 4.4 08/10/2021 12:00 AM    K 3.6 08/04/2020 12:00 AM    K 4.4 03/06/2020 03:40 AM    CL 105  10/02/2022 12:00 AM    CO2 23.0 10/02/2022 12:00 AM    GAP 10 10/02/2022 12:00 AM    BUN 10.3 10/02/2022 12:00 AM    CR 0.84 10/02/2022 12:00 AM    CR 0.85 08/10/2021 12:00 AM    CR 0.79 08/04/2020 12:00 AM    CR 0.68 03/06/2020 03:40 AM    GLU 173 10/02/2022 12:00 AM    Lab Results   Component Value Date/Time    CA 105 10/02/2022 12:00 AM    PO4 4.2 03/06/2020 03:40 AM    ALBUMIN 3.7 10/02/2022 12:00 AM    TOTPROT 6.4 10/02/2022 12:00 AM    ALKPHOS 107 10/02/2022 12:00 AM    AST 18 10/02/2022 12:00 AM    ALT 17 10/02/2022 12:00 AM    TOTBILI 1.14 10/02/2022 12:00 AM    GFR 74.3 10/02/2022 12:00 AM    GFRAA >60 03/06/2020 03:40 AM        Cardiac Enzymes Lipids   Lab Results   Component Value Date/Time    BNP 48 08/04/2020 12:00 AM    BNP <10 04/16/2019 12:00 AM    BNP 23 03/25/2019 12:00 AM    Lab Results   Component Value Date    CHOL 133 10/02/2022    TRIG 124 10/02/2022    HDL 32 10/02/2022    LDL 77 10/02/2022    VLDL 25 10/02/2022    NONHDLCHOL 95 12/11/2017    CHOLHDLC 4 10/02/2022         Coagulation Endocrine   Lab Results   Component Value Date/Time    INR 1.1 03/04/2020 07:40 PM    Lab Results   Component Value Date/Time    HGBA1C 7.2 (H) 09/12/2018 12:00 AM    HGBA1C 6.7 (H) 02/07/2017 12:00 AM    TSH 3.47 09/21/2020 12:00 AM            Imaging/Procedures:      EKG: 03/13/2021  -Sinus bradycardia with ventricular rate of 55    Transthoracic echocardiogram: 11/27/2017   Mildly depressed left ventricular systolic function. LVEF 45-50%  Mild concentric left ventricular hypertrophy.  Mild diastolic dysfunction.   No significant valvular abnormalities.  No pericardial effusion.     Transthoracic echocardiogram: 03/25/2019   LVEF=60%  Mild Concentric LVH  Normal Chamber Dimensions  Valves Are Unremarkable  No Pericardial Effusion  Normal TAPSE=2.1  PASP=53mmHg    Transthoracic echocardiogram: 08/17/2020  The left ventricular size is normal. The left ventricular wall thickness is normal. Normal geometry. The left ventricular systolic function is normal. The ejection fraction by Simpson's biplane method is 66%. There are no segmental wall motion abnormalities. Normal left ventricular diastolic function.  The right ventricular size, wall thickness and systolic function are normal. The pulmonary artery pressure could not be estimated due to inadequate tricuspid regurgitation signal.  Structure and function of the valves is normal.  The aortic root and ascending aorta are normal in size.  No pericardial effusion.  Compared to study 03/25/2019, there is no significant change.    Cardiac catheterization: 12/11/2017  FINDINGS:  HEMODYNAMICS:    Left ventricular pressure 140/EDP 10 mmHg.  Aortic pressure 142/68.   ANATOMY:  Left main:  The left main appears to be free of any significant disease.  Arises from the left coronary cusp.  It is normal.  It bifurcates into the left anterior descending and circumflex.  Left anterior descending:  The left anterior descending is a type 2 to 3 LAD.  It has  mild diffuse irregularities in the proximal to midportion in the range of 30%.  It supplies a smaller 1st diagonal and has a 90% stenosis in the proximal portion.  This, however, is a very small diameter vessel.  All the other diagonals are quite small.  Circumflex supplies a single large obtuse marginal that has a 50% stenosis.  The circumflex itself is free of any significant disease.  Proximal to this significant obtuse marginal, there are only very small obtuse marginals.  Right coronary artery:  The right coronary artery is dominant, arising from the right coronary cusp.  It has 20% proximal and mid and distal irregularities.  The posterior descending has a 30% stenosis and the posterolateral has a 20% stenosis.  The right coronary artery is dominant.  CONCLUSIONS:    Coronary artery disease, although no severe stenoses except for very small diagonal has a 90% stenosis.  Normal hemodynamics.  Small radial artery leading to discomfort during the procedure, although not prohibitive.  If she needs repeat procedures, it may be more comfortable for her to perform this from a femoral access point.      Assessment/Plan:  Meghan Carlson is a 72 y.o. female who is here for follow-up visit.    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     Marland Kitchen Heart failure with improved ejection fraction   * Etiology - Nonischemic cardiomyopathy   * Diagnosed in 2019 - EF of 45% at that time.  Most recent TTE in May 2022 with EF of 66% and normal RV size and function  * NYHA Class I-II. Stage C. Appears euvolemic on exam.  - HF Therapy:  BB: Carvedilol 12.5 mg twice daily  ACE-I/ARB/ARNI: Losartan 50 mg daily   MRA: None indicated  SGLT2i: None due to cost  CRT/ICD: N/A  - Volume:   Loop: Furosemide 20 mg as needed (usually every other day)  Thiazide: N/A  - Labs: None  - TTE in 6 months     . Coronary artery disease, nonobstructive   * Cardiac cath in 12/2017 showing small diagonal with 90% stenosis, otherwisee 30% prox-mid LAD and 50% OM and 20% prox-mid RCA stenoses  . Hyperlipidemia  * Patient without anginal symptoms.  - Aspirin 81 mg daily  - Atorvastatin 80 mg daily  - Adding zetia 10 mg daily as last LDL >70  - Sublingual nitroglycerin as needed   - Repeat lipid panel in 3 months    . Hypertension  * Home blood pressures within goal (120s over 70s)   - See heart failure management above    . Obesity (BMI 34)   . Obstructive sleep apnea   - Compliant with CPAP  - Encouraged moderate intensity exercise    Return to clinic:  in 6 months with general cardiology in [place]    Thank you for allowing me to participate in the care of this patient.  Please do not hesitate to contact me should you have any questions or concerns.     Total time spent on today's office visit was 55 minutes. This includes face-to-face in person visit with patient as well as nonface-to-face time including review of the EMR, outside records, labs, radiologic studies, echocardiogram & other cardiovascular studies, formulation of treatment plan, after visit summary, future disposition, and lastly on documentation.    Idamae Lusher, MD  Advanced Heart  Failure and Heart Transplant Cardiologist  [facility]  [facility]

## 2023-04-17 ENCOUNTER — Encounter: Admit: 2023-04-17 | Discharge: 2023-04-17 | Payer: MEDICARE

## 2023-04-17 ENCOUNTER — Ambulatory Visit: Admit: 2023-04-17 | Discharge: 2023-04-17 | Payer: MEDICARE

## 2023-04-17 DIAGNOSIS — E1165 Type 2 diabetes mellitus with hyperglycemia: Secondary | ICD-10-CM

## 2023-04-17 DIAGNOSIS — E119 Type 2 diabetes mellitus without complications: Secondary | ICD-10-CM

## 2023-04-17 DIAGNOSIS — E039 Hypothyroidism, unspecified: Secondary | ICD-10-CM

## 2023-04-17 MED ORDER — ALENDRONATE 70 MG PO TAB
70 mg | ORAL_TABLET | ORAL | 3 refills | Status: AC
Start: 2023-04-17 — End: ?

## 2023-04-17 MED ORDER — XULTOPHY 100/3.6 100 UNIT-3.6 MG /ML (3 ML) SC INPN
24 [IU] | Freq: Every day | SUBCUTANEOUS | 3 refills | Status: AC
Start: 2023-04-17 — End: ?

## 2023-04-17 NOTE — Patient Instructions
Discussed starting Alendronate 70mg  once weekly.  -Start Alendronate 70mg  weekly on an empty stomach and wait 30 mins before eating anything. sit upright for 30 mins to reduce the chance of pill esophagitis (inflammation of food pipe from the medicine).  -inform dentist that you are taking this medicine. If need dental extraction, please hold the medicine one month before and after the procedure at least. Call us if you have concerns.    -increase Xultophy to 24 units. Fasting glucose goal is 100-120. You can increase to 26 U If not at goal    -take 1000 international unit(s) D3 daily or you may take 5000 international unit(s) D3 three times a week    - take one cup of yogurt (low fat) daily.

## 2023-04-17 NOTE — Progress Notes
Date of Service: 04/17/2023    Subjective:             Meghan Carlson is a 73 y.o. female here for DM and hypothyroidism follow up and to discuss osteoporosis. She is here with her grand daughter today.     History of Present Illness  I saw her in 2022 after which she saw Ashlee Orndoff.     Interval history  Diabetes type 2:  Dx 2011  Rx: xultophy 20 U daily morning and glipizide 2.5 mg BID with meals but forgets 2-3 times a week to take it  Metformin caused GI a/e     A1c  8.7% today   POC glucose 153 mg/dl today    She forgot to bring her libre reader. We did not have glucose data to review.  She notes fasting to be around 150-160 mg/dL.  She has no lows <70 per verbal report.    2-3 am she sleeps, around noon first meal and 6-7 pm has dinner  No GI side effects with current medications     Previously tried:   Invokana (stopped due to recurrent UTI)   Metformin 1G BID (diarhea)     Eyes: Dec 2024. Has cataracts, no retinopathy per her    No History of pancreatitis. No history of thyroid cancer in family.     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 No recent testing.  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   Hormonal workup done on 12/2019 did not show cortisol or ACTH deficiency.   Denies any s/s adrenal insufficiency--no fatigue, dizziness, un explained hypoglycemia at this time.     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  last in 2020 per patient. None since last visit.   Right wrist fracture with mild trauma in 1990 after trauma  Was on IM steroid injection for 2 years.  No family history of osteoporosis or osteopenia or parathyroid disease    She is not taking calcium tablets   Taking 1-2 serving of dairy products    08/04/2020 00:00   Sodium, 24 HR 66   Sodium, Random 66   Creatinine, Random 52.64   Microalbumin, Random <5.0   Calcium 24 Hr 341.3 (A)   Creatinine, Ur 31.05 (A)   Urine Volume 3,750   Creatinine, 24 HR Urine 1.2   Microalbumin/CR ratio Urine *      Vitamin D deficiency  Vitamin D(25-OH)Total   Date Value Ref Range Status   10/02/2022 28 30 - 100 ng/mL Final   Is on 1000 units but does not take consistently      Osteopenia  Bone density done on 04/2020 showed osteopenia with low FRAX score (although very close to osteoporosis). No falls or fractures since last visit.   She had [facility] March [facility]24 which now  shows osteoporosis with T-score of -2.5 in left femoral neck. This cannot be compared to previous study as it was completed at different facility.   No fractures.   On 1000 international unit(s) Vitamin D3 supplementation inconsistently  Not currently on calcium supplementation due to hx of kidney stone and hypercalciuria    No falls, fractures reported     Review of Systems  Per HPI    Objective:          albuterol sulfate (PROAIR HFA) 90 mcg/actuation HFA aerosol inhaler Inhale two puffs by mouth into the lungs every 6 hours as needed for Wheezing or Shortness of Breath. Shake well before use.    alendronate (FOSAMAX) 70 mg tablet Take one tablet by mouth every 7 days. Take at least 30 minutes before breakfast with plain water. Do not lie down for 30 minutes.    aspirin (ASPIRIN CHILDRENS) 81 mg chewable tablet Chew one tablet by mouth daily. Take with food.    atorvastatin (LIPITOR) 80 mg tablet TAKE 1 TABLET BY MOUTH DAILY    carvediloL (COREG) 12.5 mg tablet Take one tablet by mouth twice daily with meals. Take with food.    diclofenac sodium (VOLTAREN-XR) 100 mg xr tablet Take one tablet by mouth daily. Take with food.    escitalopram oxalate (LEXAPRO) 20 mg tablet Take one tablet by mouth daily.    ezetimibe (ZETIA) 10 mg tablet Take one tablet by mouth daily.    fluticasone propionate (FLONASE) 50 mcg/actuation nasal spray Apply  to each nostril as directed as Needed. Shake bottle gently before using.     furosemide (LASIX) 20 mg tablet TAKE 2 TABLETS BY MOUTH IN THE MORNING AS NEEDED FOR SWELLING OR WEIGHT GAIN (Patient taking differently: Take one tablet by mouth every 48 hours.)    glipiZIDE CR (GLUCOTROL XL) 2.5 mg tablet Take one tablet by mouth twice daily with meals.    HYDROcodone/acetaminophen (NORCO) 10/325 mg tablet Take one tablet by mouth every 6 hours as needed for Pain.    insulin degludec-liraglutide (XULTOPHY 100/3.6) 100 unit-3.6 mg /mL (3 mL) injection PEN Inject twenty four Units under the skin daily. You may increase to 26 U if fasting sugar is not between 100-120 mg/dL    levothyroxine (SYNTHROID) 50 mcg tablet Take one tablet by mouth daily 30 minutes before breakfast.    losartan (COZAAR) 50 mg tablet Take one tablet by mouth daily.    nitroglycerin (NITROSTAT) 0.4 mg tablet Place 1 tab under tongue every 5 minutes as needed for Chest Pain (Not to exceed 3 doses /15 min. If pain persists , seek medical attention    traMADoL 100 mg tablet TAKE 1/2 TO 1 (ONE-HALF TO ONE) TABLET BY MOUTH EVERY 6 HOURS AS NEEDED FOR PAIN. DO NOT EXCEED 4 DOSES PER DAY    triamcinolone acetonide (KENALOG) 0.1 % topical cream Apply  topically to affected area as Needed.    valACYclovir (VALTREX) 500 mg tablet Take one tablet by mouth every 24 hours.     Vitals:    04/17/23 1314   BP: 117/69   BP Source: Arm, Right Upper   Pulse: (P) 58   PainSc: Two   Weight: 85.9 kg (189 lb 6.4 oz)   Height: 160 cm (5' 3)       Body mass index is 33.55 kg/m?Marland Kitchen     Physical Exam  Nursing note reviewed.   Constitutional:       Appearance: Normal appearance.   HENT:  Head: Normocephalic and atraumatic.      Nose: Nose normal.   Eyes:      General: No scleral icterus.  Neck:      Vascular: No carotid bruit.   Cardiovascular:      Rate and Rhythm: Normal rate and regular rhythm.      Pulses: Normal pulses.      Heart sounds: Normal heart sounds.   Pulmonary:      Effort: Pulmonary effort is normal. No respiratory distress.      Breath sounds: No rales.   Musculoskeletal:         General: No tenderness (none over spine).   Neurological:      General: No focal deficit present.      Mental Status: She is alert and oriented to person, place, and time. Mental status is at baseline.   Psychiatric:         Mood and Affect: Mood normal.         Behavior: Behavior normal.         Thought Content: Thought content normal.         Judgment: Judgment normal.               Lab Results   Component Value Date    URMALBCRRAT 10.0 10/02/2022    TSH 3.47 09/21/2020    VITD25 28 10/02/2022         Comprehensive Metabolic Profile    Lab Results   Component Value Date/Time    NA 138 10/02/2022 12:00 AM    K 4.2 10/02/2022 12:00 AM    CL 105 10/02/2022 12:00 AM    CO2 23.0 10/02/2022 12:00 AM    GAP 10 10/02/2022 12:00 AM    BUN 10.3 10/02/2022 12:00 AM    CR 0.84 10/02/2022 12:00 AM    GLU 173 10/02/2022 12:00 AM    Lab Results   Component Value Date/Time    CA 105 10/02/2022 12:00 AM    PO4 4.2 03/06/2020 03:40 AM    ALBUMIN 3.7 10/02/2022 12:00 AM    TOTPROT 6.4 10/02/2022 12:00 AM    ALKPHOS 107 10/02/2022 12:00 AM    AST 18 10/02/2022 12:00 AM    ALT 17 10/02/2022 12:00 AM    TOTBILI 1.14 10/02/2022 12:00 AM    GFR 74.3 10/02/2022 12:00 AM    GFRAA >60 03/06/2020 03:40 AM        CBC w diff    Lab Results   Component Value Date/Time    WBC 9.3 03/06/2020 03:40 AM    RBC 4.10 03/06/2020 03:40 AM    HGB 12.3 03/06/2020 03:40 AM    HCT 37.7 03/06/2020 03:40 AM    MCV 92.0 03/06/2020 03:40 AM    MCH 30.0 03/06/2020 03:40 AM    MCHC 32.6 03/06/2020 03:40 AM    RDW 14.5 03/06/2020 03:40 AM    PLTCT 273 10/02/2022 12:00 AM    MPV 9.2 03/06/2020 03:40 AM    No results found for: NEUT, ANC, LYMA, ALC, MONA, AMC, EOSA, AEC, BASA, ABC     Lab Results   Component Value Date    CHOL 133 10/02/2022    TRIG 124 10/02/2022    HDL 32 10/02/2022    LDL 77 10/02/2022    VLDL 25 10/02/2022    NONHDLCHOL 95 12/11/2017    CHOLHDLC 4 10/02/2022          Lab Results   Component Value Date  A1C 8.7 (A) 04/17/2023    A1C 8.4 (A) 12/12/2022    A1C 8.0 (A) 06/20/2022    HGBA1C 7.2 (H) 09/12/2018    HGBA1C 6.7 (H) 02/07/2017         BP Readings from Last 5 Encounters:   04/17/23 117/69   01/23/23 116/64   12/12/22 106/64   06/20/22 108/57   12/18/21 122/82     BMI Readings from Last 8 Encounters:   04/17/23 33.55 kg/m?   01/23/23 33.80 kg/m?   12/12/22 34.01 kg/m?   06/20/22 33.66 kg/m?   12/18/21 34.01 kg/m?   08/07/21 33.66 kg/m?   05/15/21 33.34 kg/m?   03/13/21 33.30 kg/m?       Wt Readings from Last 8 Encounters:   04/17/23 85.9 kg (189 lb 6.4 oz)   01/23/23 86.5 kg (190 lb 12.8 oz)   12/12/22 87.1 kg (192 lb)   06/20/22 86.2 kg (190 lb)   12/18/21 87.1 kg (192 lb)   08/07/21 86.2 kg (190 lb)   05/15/21 85.4 kg (188 lb 3.2 oz)   03/13/21 85.3 kg (188 lb)            Assessment and Plan:  Diabetes mellitus type 2- un-controlled  glipizide 2.5 mg daily   Xultophy 20 units daily   A1c 8.7 %. Goal 7-7.5 %  Ozempic was cost prohibitive. SGLTi discontinued due to UTI's.  Plan:   -continue glipizide 2.5 mg BID   -Increase Xultophy to 24 units daily. Increase to 26 U if morning fasting glucose not between 100-120 mg/dL  - continue Libre 3   -repeat urine microalbumin    Diabetes mellitus annual monitoring  Urine microalbumin:  No microalbuminuria in June 2024 (need to correctly enter the previous report in the system)  Lipid panel: 08/10/2021 shows LDL within goal -- repeat in June 2024 LDL at target   Blood chemistry: last done on June 2024 with normal creatinine       Hypothyroidism  Lab Results   Component Value Date TSH 3.47 09/21/2020    FREET4R 1.09 01/05/2022     Currently on levothyroxine 50 mcg daily.  Check TSH and free T4    Osteoporosis  Bone density done on 04/2020 showed osteopenia with low FRAX score (although very close to osteoporosis).  Repeat bone density was completed in March 2024 at [facility]. It cannot be compared to previous scans due to different tables/locations.   Pt now in osteoporosis range with t-score of -2.5 in left femoral neck   We discussed different treatments. Anabolic vs. Anti resorptive therapy and their indications, side effects. I advise she start alendronate once a week. When taken regularly, the medication reduces fracture risk by 50 % and maintains BMD and overtime there is some gain in BMD as well.  She has no hx of reflux/heart burn  Take 1000 international unit(s) D3 daily or 5000 international unit(s) D3 three times a week  Take 2 servings of dairy daily, increase water intake to make 2 L of urine daily       Vitamin D deficiency  Lab Results   Component Value Date    VITD25 28 10/02/2022   Plan as above      Hypercalciuria  Lab Results   Component Value Date    CA 105 10/02/2022    ALBUMIN 3.7 10/02/2022    PTH 31 12/24/2019   Check random urine calcium and creatinine  Normal serum calcium on recent BMP    Chronic combined  systolic HF   Managed by cardiology      Patient verbalized understanding of the plan and is in agreement.    Tomio Kirk  Assistant Professor, Endocrinology      Patient Instructions   Discussed starting Alendronate 70mg  once weekly.  -Start Alendronate 70mg  weekly on an empty stomach and wait 30 mins before eating anything. sit upright for 30 mins to reduce the chance of pill esophagitis (inflammation of food pipe from the medicine).  -inform dentist that you are taking this medicine. If need dental extraction, please hold the medicine one month before and after the procedure at least. Call us if you have concerns.    -increase Xultophy to 24 units. Fasting glucose goal is 100-120. You can increase to 26 U If not at goal    -take 1000 international unit(s) D3 daily or you may take 5000 international unit(s) D3 three times a week    - take one cup of yogurt (low fat) daily.     Diagnoses and all orders for this visit:    Type 2 diabetes mellitus without complication, without long-term current use of insulin (HCC)  -     POC GLUCOSE QUANTITATIVE BLOOD  -     POC HEMOGLOBIN A1C  -     Cancel: GLUCOSE METER DOWNLOAD    Age-related osteoporosis without current pathological fracture  -     CALCIUM-URINE RANDOM; Future; Expected date: 04/17/2023  -     CREATININE-URINE RANDOM; Future; Expected date: 04/17/2023    Acquired hypothyroidism  -     THYROID STIMULATING HORMONE-TSH; Future; Expected date: 04/17/2023  -     FREE T4 (FREE THYROXINE) ONLY; Future; Expected date: 04/17/2023    Type 2 diabetes mellitus with hyperglycemia, without long-term current use of insulin (HCC)  -     insulin degludec-liraglutide (XULTOPHY 100/3.6) 100 unit-3.6 mg /mL (3 mL) injection PEN; Inject twenty four Units under the skin daily. You may increase to 26 U if fasting sugar is not between 100-120 mg/dL  -     MICROALB/CR RATIO-URINE RANDOM; Future; Expected date: 04/17/2023    Other orders  -     alendronate (FOSAMAX) 70 mg tablet; Take one tablet by mouth every 7 days. Take at least 30 minutes before breakfast with plain water. Do not lie down for 30 minutes.         Orders Placed This Encounter    CALCIUM-URINE RANDOM    CREATININE-URINE RANDOM    THYROID STIMULATING HORMONE-TSH    FREE T4 (FREE THYROXINE) ONLY    MICROALB/CR RATIO-URINE RANDOM    POC GLUCOSE QUANTITATIVE BLOOD    POC HEMOGLOBIN A1C    insulin degludec-liraglutide (XULTOPHY 100/3.6) 100 unit-3.6 mg /mL (3 mL) injection PEN    alendronate (FOSAMAX) 70 mg tablet

## 2023-04-18 DIAGNOSIS — M810 Age-related osteoporosis without current pathological fracture: Secondary | ICD-10-CM

## 2023-05-16 ENCOUNTER — Encounter: Admit: 2023-05-16 | Discharge: 2023-05-16 | Payer: MEDICARE

## 2023-07-24 ENCOUNTER — Encounter: Admit: 2023-07-24 | Discharge: 2023-07-24 | Payer: MEDICARE

## 2023-07-24 ENCOUNTER — Ambulatory Visit: Admit: 2023-07-24 | Discharge: 2023-07-24 | Payer: MEDICARE

## 2023-07-24 DIAGNOSIS — E039 Hypothyroidism, unspecified: Secondary | ICD-10-CM

## 2023-07-24 MED ORDER — LEVOTHYROXINE 50 MCG PO TAB
50 ug | ORAL_TABLET | Freq: Every day | ORAL | 3 refills | 30.00000 days | Status: AC
Start: 2023-07-24 — End: ?

## 2023-07-25 ENCOUNTER — Ambulatory Visit: Admit: 2023-07-25 | Discharge: 2023-07-25 | Payer: MEDICARE

## 2023-07-25 ENCOUNTER — Encounter: Admit: 2023-07-25 | Discharge: 2023-07-25 | Payer: MEDICARE

## 2023-07-25 DIAGNOSIS — I2510 Atherosclerotic heart disease of native coronary artery without angina pectoris: Secondary | ICD-10-CM

## 2023-07-25 DIAGNOSIS — I10 Essential (primary) hypertension: Secondary | ICD-10-CM

## 2023-07-25 DIAGNOSIS — I5042 Chronic combined systolic (congestive) and diastolic (congestive) heart failure: Secondary | ICD-10-CM

## 2023-07-25 DIAGNOSIS — E785 Hyperlipidemia, unspecified: Secondary | ICD-10-CM

## 2023-07-25 DIAGNOSIS — E1169 Type 2 diabetes mellitus with other specified complication: Secondary | ICD-10-CM

## 2023-07-25 DIAGNOSIS — Z136 Encounter for screening for cardiovascular disorders: Secondary | ICD-10-CM

## 2023-07-25 DIAGNOSIS — R0609 Other forms of dyspnea: Secondary | ICD-10-CM

## 2023-07-25 NOTE — Progress Notes
 Cardiovascular Medicine       Date of Service: 07/25/2023      HPI     Meghan Carlson is a 73 y.o. female who was seen today in the Cardiovascular Medicine Clinic at [facility] at our [place] office.     She previously saw Dr. Kerin Pebbles and Dr. Roldan in heart failure clinic.     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      Today the patient presents to the clinic visit alone.   She is doing well, has mild chronic stable dyspnea that has not changed significantly over the past several years.  She takes Lasix  20 mg daily and reports stable weight and breathing with this.  She denies chest pain, chest pressure/discomfort, palpitations, irregular heart beats, near-syncope, syncope, fatigue, orthopnea, paroxysmal nocturnal dyspnea, lower extremity edema.   Current exercise: no regular exercise - thinking about getting L knee surgery to help with mobility.   She states she is compliant with medications.   Home BP readings are around 120/60s  Home weight has been stable around 188 -191 pounds.  Patient's endocrinologist looked into the cost of SGLT2i and GLP-1 agonists but they were too expensive. She is taking glipizide  and xultophy .      EKG: 03/13/2021  -Sinus bradycardia with ventricular rate of 55     Transthoracic echocardiogram: 11/27/2017   Mildly depressed left ventricular systolic function. LVEF 45-50%  Mild concentric left ventricular hypertrophy.  Mild diastolic dysfunction.   No significant valvular abnormalities.  No pericardial effusion.      Transthoracic echocardiogram: 03/25/2019   LVEF=60%  Mild Concentric LVH  Normal Chamber Dimensions  Valves Are Unremarkable  No Pericardial Effusion  Normal TAPSE=2.1  PASP=80mmHg     Transthoracic echocardiogram: 08/17/2020  The left ventricular size is normal. The left ventricular wall thickness is normal. Normal geometry. The left ventricular systolic function is normal. The ejection fraction by Simpson's biplane method is 66%. There are no segmental wall motion abnormalities. Normal left ventricular diastolic function.  The right ventricular size, wall thickness and systolic function are normal. The pulmonary artery pressure could not be estimated due to inadequate tricuspid regurgitation signal.  Structure and function of the valves is normal.  The aortic root and ascending aorta are normal in size.  No pericardial effusion.  Compared to study 03/25/2019, there is no significant change.    Transthoracic echocardiogram 07/2023:    The left ventricular systolic function is normal. The visually estimated ejection fraction is 65%. There are no segmental wall motion abnormalities.    Normal left ventricular diastolic function. Normal left atrial pressure.    The right ventricular size is normal. The right ventricular systolic function is normal. M-Mode TAPSE 1.8 cm (normal >1.7 cm).    There is mitral annular calcification without stenosis, no regurgitation.    Trace tricuspid valve regurgitation.    Estimated Peak Systolic PA Pressure 13 mmHg.        Cardiac catheterization: 12/11/2017  FINDINGS:  HEMODYNAMICS:    Left ventricular pressure 140/EDP 10 mmHg.  Aortic pressure 142/68.   ANATOMY:  Left main:  The left main appears to be free of any significant disease.  Arises from the left coronary cusp.  It is normal.  It bifurcates into the left anterior descending and circumflex.  Left anterior descending:  The left anterior descending is a type 2 to 3 LAD.  It has mild diffuse irregularities in the proximal to midportion in the range of 30%.  It supplies a smaller 1st diagonal and has a 90% stenosis in the proximal portion.  This, however, is a very small diameter vessel.  All the other diagonals are quite small.  Circumflex supplies a single large obtuse marginal that has a 50% stenosis.  The circumflex itself is free of any significant disease.  Proximal to this significant obtuse marginal, there are only very small obtuse marginals.  Right coronary artery:  The right coronary artery is dominant, arising from the right coronary cusp.  It has 20% proximal and mid and distal irregularities.  The posterior descending has a 30% stenosis and the posterolateral has a 20% stenosis.  The right coronary artery is dominant.  CONCLUSIONS:    Coronary artery disease, although no severe stenoses except for very small diagonal has a 90% stenosis.  Normal hemodynamics.  Small radial artery leading to discomfort during the procedure, although not prohibitive.  If she needs repeat procedures, it may be more comfortable for her to perform this from a femoral access point.                 Assessment & Plan   73 y.o. 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        Heart failure with improved ejection fraction  Nonischemic cardiomyopathy.  Coronary artery disease.  Hyperlipidemia.  Hypertension.  Obstructive sleep apnea.  Obesity.  Encounter for preoperative cardiovascular risk ratification.      * Etiology - Nonischemic cardiomyopathy   * Diagnosed in 2019 - EF of 45% at that time.  Most recent TTE in April 2025 with EF of 65% and normal RV size and function  * NYHA Class I-II. Stage C. Appears euvolemic on exam.  - HF Therapy:  BB: Carvedilol  12.5 mg twice daily  ACE-I/ARB/ARNI: Losartan  50 mg daily   MRA: None indicated  SGLT2i: None due to cost  CRT/ICD: N/A  - Volume:   Loop: Furosemide  20 mg as needed (usually every day)  Thiazide: N/A  - Labs: BMP     . Coronary artery disease, nonobstructive   * Cardiac cath in 12/2017 showing small diagonal with 90% stenosis, otherwisee 30% prox-mid LAD and 50% OM and 20% prox-mid RCA stenoses  . Hyperlipidemia  * Patient without anginal symptoms.  - Aspirin  81 mg daily  - Atorvastatin  80 mg daily, ezetimibe  10 mg daily.  - Sublingual nitroglycerin  as needed   - Repeat lipid panel in 3 months     . Hypertension  * Home blood pressures within goal (120s over 70s)   - See heart failure management above     . Obesity (BMI 34)   . Obstructive sleep apnea   - Compliant with CPAP  - Encouraged moderate intensity exercise    Preoperative cardiovascular risk ratification:  Given she is NYHA class I with no significant limiting chest pain or shortness of breath, can proceed to knee surgery with no further evaluation.  Would advise holding losartan  on a.m. of surgery to avoid intraoperative hypotension.     Return to clinic:  in 1 year with general cardiology in 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 The left ventricular wall thickness is normal. Normal geometry. The left ventricular systolic function is normal. The ejection fraction by Simpson's biplane method is 66%. There are no segmental wall motion abnormalities. Normal left ventricular diastolic function. The right ventricular size, wall thickness and systolic function are normal. The pulmonary artery pressure could not be estimated due to inadequate tricuspid regurgitation signal. Structure and function of the valves is normal. The aortic root and ascending aorta are normal in size. No pericardial effusion. Compared to study 03/25/2019, there is no significant change.  03/25/2019 - Echocardiogram at [facility]: LVEF=60% Mild Concentric LVH Normal Chamber Dimensions Valves Are Unremarkable No Pericardial Effusion Normal TAPSE=2.1 PASP=33mmHg  11/27/2017 - [facility]chocardiogram at [facility]: Mildly depressed left ventricular systolic function. LVEF 45-50% Mild concentric left ventricular hypertrophy. Mild diastolic dysfunction. No significant valvular abnormalities. No pericardial effusion.       Lactic acidosis 03/07/2020    Subdural hematoma (CMS-HCC) 03/04/2020    Coronary artery disease involving native coronary artery of native heart without angina pectoris 08/25/2018     Cardiac Cath - 12/11/2017 at [facility] - Coronary artery disease, although no severe stenoses except for very small diagonal has a 90% stenosis. Normal hemodynamics. Small radial artery leading to discomfort during the procedure, although not prohibitive.  If she needs repeat procedures, it may be more comfortable for her to perform this from a femoral access point.  Bruce Treadmill  Stress Test - 11/27/17 at [facility] - This study is abnormal for a couple of reasons. There is a small sized, mild to moderate intensity, reversible perfusion abnormality of the mid to apical inferior wall. This is suggestive of potential ischemia in the RCA distribution. Additionally, the calculated global LV systolic function is moderately reduced. Subjectively, there appears to be only mild LV systolic dysfunction. There is reduced thickening in the apical inferior wall. The patient exercised for only 4 minutes on a treadmill protocol. There is no reported angina. There were no ischemic ECG changes. There were occasional premature ventricular contractions at peak exercise and in the immediate recovery period. The exercise ECG was negative for ischemia.       Carotid artery calcification 03/18/2018     03/05/18 Carotid duplex:  Minimal calcified plaque in the right carotid bulb.  No hemodynamically significant common or internal carotid artery stenosis.        Abnormal stress test 12/11/2017    DOE (dyspnea on exertion) 11/28/2017    Hypothyroidism 11/11/2017    Dyslipidemia 11/11/2017    Essential hypertension 11/11/2017    Obesity 11/11/2017    Diabetes mellitus (CMS-HCC) 11/11/2017    OSA (obstructive sleep apnea) 11/11/2017       I reviewed and confirmed this patient's problem list, active medications, allergies, and past medical, social, family & tobacco histories.     Review of Systems  Review of Systems   Constitutional: Positive for malaise/fatigue.   HENT: Negative.     Eyes: Negative.    Cardiovascular:  Positive for dyspnea on exertion and irregular heartbeat.   Respiratory:  Positive for shortness of breath.    Endocrine: Negative.    Hematologic/Lymphatic: Negative.    Skin: Negative.    Musculoskeletal:  Positive for joint pain.   Gastrointestinal: Negative.    Genitourinary: Negative.  Neurological: Negative.    Psychiatric/Behavioral: Negative.     Allergic/Immunologic: Negative.      14 point review of systems negative except as above.    Vitals:    07/25/23 1204   BP: 120/72   BP Source: Arm, Left Upper   Pulse: 67   SpO2: 97%   O2 Device: None (Room air)   PainSc: Six   Weight: 86.3 kg (190 lb 3.2 oz)   Height: 160 cm (5' 3)     Body mass index is 33.69 kg/m?Aaron Aas     Physical Exam  General Appearance: no acute distress  HEENT: EOMI, mucous membranes moist, oropharynx is clear  Neck Veins: neck veins are flat & not distended  Carotid Arteries: no bruits  Chest Inspection: chest is normal in appearance  Auscultation/Percussion: lungs clear to auscultation, no rales, rhonchi, or wheezing  Cardiac Rhythm: regular rhythm & normal rate  Cardiac Auscultation: Normal S1 & S2, no S3 or S4, no rub  Murmurs: no cardiac murmurs  Abdominal Exam: soft, non-tender, normal bowel sounds, no masses or bruits  Abdominal aorta: nonpalpable   Liver & Spleen: no organomegaly  Extremities: no lower extremity edema; palpable distal pulses  Skin: warm & intact  Neurologic Exam: oriented to time, place and person; no focal neurologic deficits       Cardiovascular Studies  07/24/23   2D + DOPPLER ECHO   Result Value Ref Range    BSA 1.96 m2    LVOT diameter 2.03 cm    IVS 0.69 0.6 - 0.9 cm    LVIDD 4.61 3.8 - 5.2 cm    LVIDS 3.31 2.2 - 3.5 cm    PW 0.72 0.6 - 0.9 cm    Left Ventricle Diastolic Volume 81.00 46 - 106 mL    Left Ventricle Diastolic Volume Index 41 29 - 61 milliliters per square meter    Left Ventricle Systolic Volume 31.00 14 - 42 mL    Left Ventricle Systolic Volume Index 16 8 - 24 milliliters per square meter    LVOT peak vel 1.06 m/s    LVOT peak VTI 25.77 cm    TDI lateral e' 0.10 m/s    Right Ventricular Mid Diameter 2.38 1.9 - 3.5 cm    LA size 3.19 2.7 - 3.8 cm    LA volume 33.16 22 - 52 mL    Right Atrial Area 12.37 <18 cm2    , with a mean gradient of 5.39 mmHg    AV peak velocity 1.50 m/s    Ao VTI 32.65 cm    MV Peak A Vel 0.94 m/s    MV Peak E Vel PW 0.78 m/s    Right Ventricular Basal Diameter 3.15 2.5 - 4.1 cm    Right Heart Systolic Mmode TAPSE 1.84 >1.7 cm    Right Heart Systolic TDI S' 0.11 m/s    Sinus 3.37 2.4 - 3.6 cm    Ascending aorta 3.33 cm    FS 28.20 28 - 44 %    Teichholtz 47.94 %    Left Atrium Index 16.92 16 - 34 mL/m2    LV mass 101 67 - 162 g    Left Ventricle Mass Index 51 43 - 95 g/m2    RWT 0.31 <=0.42    LVOT area 3.24 cm2    LVOT stroke volume 83.41 cm3    Aortic valve area = 2.55 cm2    AV index (native) 0.71     E/A ratio  0.83     Lateral E/E' ratio 7.80     TR PEAK VELOCITY 1.6 m/s    RV SYSTOLIC PRESSURE 10     RA PRESSURE 3     TV rest pulmonary artery pressure 13 mmHg    AV Stroke Volume Index 43     TDI Medial e' 0.070 m/s    Medial E/E' ratio 11.14     CV ECHO PV SUPPORT STAFF steve, rn     Cardiology Ultrasound Machine Siemens [id number]     ECHO EF 65 %        Cardiovascular Health Factors  Vitals BP Readings from Last 3 Encounters:   07/25/23 120/72   07/24/23 119/72   04/17/23 117/69     Wt Readings from Last 3 Encounters:   07/25/23 86.3 kg (190 lb 3.2 oz)   07/24/23 86.1 kg (189 lb 12.8 oz)   04/17/23 85.9 kg (189 lb 6.4 oz)     BMI Readings from Last 3 Encounters:   07/25/23 33.69 kg/m?   07/24/23 33.63 kg/m?   04/17/23 33.55 kg/m?      Smoking Social History     Tobacco Use   Smoking Status Never   Smokeless Tobacco Never      Lipid Profile Cholesterol   Date Value Ref Range Status   10/02/2022 133 <200 mg/dL Final     HDL   Date Value Ref Range Status   10/02/2022 32 >=40 mg/dL Final     LDL   Date Value Ref Range Status   10/02/2022 77 <100 mg/dL Final     Triglycerides   Date Value Ref Range Status   10/02/2022 124 <150 mg/dL Final      Blood Sugar Hemoglobin A1C   Date Value Ref Range Status   09/12/2018 7.2 (H) <5.7 Final     Glucose   Date Value Ref Range Status   10/02/2022 173 70 - 105 mg/dL Final   16/01/9603 540 70 - 105 mg/dL Final   98/02/9146 829 (H) 70 - 105 Final     Glucose, POC   Date Value Ref Range Status   04/17/2023 153 (A) 70 - 100 Final   12/12/2022 247 (A) 70 - 100 Final   06/20/2022 219 (A) 70 - 100 Final        ASCVD Risk Assessment:     ASCVD 10-year risk calculated: The 10-year ASCVD risk score (Arnett DK, et al., 2019) is: 24.3%    Values used to calculate the score:      Age: 80 years      Sex: Female      Is Non-Hispanic African American: No      Diabetic: Yes      Tobacco smoker: No      Systolic Blood Pressure: 120 mmHg      Is BP treated: Yes      HDL Cholesterol: 32 mg/dL      Total Cholesterol: 133 mg/dL     LDL 56-213, if ASCVD 10-y risk is >7.5%, high to moderate-intensity statin therapy is recommended  Diabetes with ASCVD 10-y risk >7.5%, high-intensity statin therapy is recommended.  Diabetes with ASCVD 10-y risk <7.5%, moderate-intensity statin therapy is recommended.      Current Medications (including today's revisions)   albuterol  sulfate (PROAIR  HFA) 90 mcg/actuation HFA aerosol inhaler Inhale two puffs by mouth into the lungs every 6 hours as needed for Wheezing or Shortness of Breath. Shake well before use.    alendronate  (FOSAMAX )  70 mg tablet Take one tablet by mouth every 7 days. Take at least 30 minutes before breakfast with plain water . Do not lie down for 30 minutes.    aspirin  (ASPIRIN  CHILDRENS) 81 mg chewable tablet Chew one tablet by mouth daily. Take with food.    atorvastatin  (LIPITOR) 80 mg tablet TAKE 1 TABLET BY MOUTH DAILY    carvediloL  (COREG ) 12.5 mg tablet Take one tablet by mouth twice daily with meals. Take with food.    diclofenac sodium (VOLTAREN-XR) 100 mg xr tablet Take one tablet by mouth as Needed. Take with food.    escitalopram  oxalate (LEXAPRO ) 20 mg tablet Take one tablet by mouth daily.    ezetimibe  (ZETIA ) 10 mg tablet Take one tablet by mouth daily.    fluticasone propionate (FLONASE) 50 mcg/actuation nasal spray Apply  to each nostril as directed as Needed. Shake bottle gently before using.     furosemide  (LASIX ) 20 mg tablet TAKE 2 TABLETS BY MOUTH IN THE MORNING AS NEEDED FOR SWELLING OR WEIGHT GAIN (Patient taking differently: Take one tablet by mouth daily.)    glipiZIDE  CR (GLUCOTROL  XL) 2.5 mg tablet Take one tablet by mouth twice daily with meals.    HYDROcodone/acetaminophen  (NORCO) 10/325 mg tablet Take one tablet by mouth every 6 hours as needed for Pain.    insulin  degludec-liraglutide (XULTOPHY  100/3.6) 100 unit-3.6 mg /mL (3 mL) injection PEN Inject twenty four Units under the skin daily. You may increase to 26 U if fasting sugar is not between 100-120 mg/dL    levothyroxine  (SYNTHROID ) 50 mcg tablet Take one tablet by mouth daily 30 minutes before breakfast.    losartan  (COZAAR ) 50 mg tablet Take one tablet by mouth daily.    nitroglycerin  (NITROSTAT ) 0.4 mg tablet Place 1 tab under tongue every 5 minutes as needed for Chest Pain (Not to exceed 3 doses /15 min. If pain persists , seek medical attention    traMADoL 100 mg tablet TAKE 1/2 TO 1 (ONE-HALF TO ONE) TABLET BY MOUTH EVERY 6 HOURS AS NEEDED FOR PAIN. DO NOT EXCEED 4 DOSES PER DAY    triamcinolone acetonide (KENALOG) 0.1 % topical cream Apply  topically to affected area as Needed.    valACYclovir (VALTREX) 500 mg tablet Take one tablet by mouth every 24 hours. Balinda Bong MD  Cardiovascular Medicine.

## 2023-07-25 NOTE — Patient Instructions
 Thank you for visiting our office today.    We would like to make the following medication adjustments:  NONE       Otherwise continue the same medications as you have been doing.          We will be pursuing the following tests after your appointment today:       Orders Placed This Encounter    LIPID PROFILE    COMPREHENSIVE METABOLIC PANEL    ECG 12-LEAD     FASTING labs (10-12hrs)    We will plan to see you back in 12 months.  Please call us  in the meantime with any questions or concerns.        Please allow 5-7 business days for our providers to review your results. All normal results will go to MyChart. If you do not have Mychart, it is strongly recommended to get this so you can easily view all your results. If you do not have mychart, we will attempt to call you once with normal lab and testing results. If we cannot reach you by phone with normal results, we will send you a letter.  If you have not heard the results of your testing after one week please give us  a call.       Your Cardiovascular Medicine [place] Team Siegfried Dress, Towanda Fret, Prentice Brochure, and Kendall West)  phone number is [phone].

## 2023-07-29 ENCOUNTER — Encounter: Admit: 2023-07-29 | Discharge: 2023-07-29 | Payer: MEDICARE

## 2023-07-29 DIAGNOSIS — E1169 Type 2 diabetes mellitus with other specified complication: Secondary | ICD-10-CM

## 2023-07-29 DIAGNOSIS — Z136 Encounter for screening for cardiovascular disorders: Secondary | ICD-10-CM

## 2023-07-29 DIAGNOSIS — I10 Essential (primary) hypertension: Secondary | ICD-10-CM

## 2023-07-29 DIAGNOSIS — I5042 Chronic combined systolic (congestive) and diastolic (congestive) heart failure: Secondary | ICD-10-CM

## 2023-07-29 DIAGNOSIS — E785 Hyperlipidemia, unspecified: Secondary | ICD-10-CM

## 2023-07-29 DIAGNOSIS — R0609 Other forms of dyspnea: Secondary | ICD-10-CM

## 2023-07-29 DIAGNOSIS — I2510 Atherosclerotic heart disease of native coronary artery without angina pectoris: Secondary | ICD-10-CM

## 2023-07-29 LAB — LIPID PROFILE
CHOLESTEROL/HDL %: 4
CHOLESTEROL: 116
HDL: 33 — ABNORMAL LOW (ref >=40)
LDL: 59
TRIGLYCERIDES: 122
VLDL: 24

## 2023-07-29 LAB — COMPREHENSIVE METABOLIC PANEL
ALBUMIN: 3.9
ALK PHOSPHATASE: 79
ALT: 18
ANION GAP: 8
AST: 20
BLD UREA NITROGEN: 10
CHLORIDE: 104
CO2: 28
CREATININE: 0.8
GFR ESTIMATED: 68
POTASSIUM: 4.5
SODIUM: 140
TOTAL BILIRUBIN: 1.7 — ABNORMAL HIGH (ref 0.2–1.2)
TOTAL PROTEIN: 7

## 2023-07-31 ENCOUNTER — Encounter: Admit: 2023-07-31 | Discharge: 2023-07-31 | Payer: MEDICARE

## 2023-07-31 ENCOUNTER — Ambulatory Visit: Admit: 2023-07-31 | Discharge: 2023-08-01 | Payer: MEDICARE

## 2023-07-31 DIAGNOSIS — I5042 Chronic combined systolic (congestive) and diastolic (congestive) heart failure: Secondary | ICD-10-CM

## 2023-07-31 DIAGNOSIS — E039 Hypothyroidism, unspecified: Secondary | ICD-10-CM

## 2023-07-31 NOTE — Telephone Encounter
-----   Message from Hattie Lints, APRN-NP sent at 07/31/2023 10:31 AM CDT -----  Please send updated rx through DME for [facility] 3+ (currently on 3). Thank you!

## 2023-07-31 NOTE — Progress Notes
 Date of Service: 07/31/2023    Subjective:             Meghan Carlson is a 73 y.o. female here for DM and hypothyroidism follow up and to discuss osteoporosis. She is here with her grand daughter today.     History of Present Illness  Dr. Zafar saw patient last in January 2025.     Interval history  May need knee surgery.  Unsure of plan yet.   Having more nocturnal lows since last visit.   Started Fosamax with Dr. Zafar in January 2025 for osteoporosis. tolerating well.     Diabetes type 2:  Dx 2011  Rx:   Xultophy 26 U daily morning  glipizide 2.5 mg BID with meals but forgets 2-3 times a week to take it  Metformin caused GI a/e     A1c  8.2% April 2025   Previous A1c: 8.7% January 2025    POC glucose 153 mg/dl today    Wearing Libre 3-- TIR is up to 71% with 2% hypoglycemia.  Having more lows overnight, at least 3x/week drops to low 60s.     2-3 am she sleeps, around noon first meal and 6-7 pm has dinner  No GI side effects with current medications     Previously tried:   Invokana (stopped due to recurrent UTI)   Metformin 1G BID (diarhea)  Ozempic (cost)      Eyes: Dec 2024. Has cataracts, no retinopathy per her    No History of pancreatitis. No history of thyroid cancer in family.     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 No recent testing.  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   Hormonal workup done on 12/2019 did not show cortisol or ACTH deficiency.   Denies any s/s adrenal insufficiency--no fatigue, dizziness, un explained hypoglycemia at this time.     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  last in 2020 per patient. None since last visit.   Right wrist fracture with mild trauma in 1990 after trauma  Was on IM steroid injection for 2 years.  No family history of osteoporosis or osteopenia or parathyroid disease    She is not taking calcium tablets   Taking 1-2 serving of dairy products    08/04/2020 00:00   Sodium, 24 HR 66   Sodium, Random 66   Creatinine, Random 52.64   Microalbumin, Random <5.0   Calcium 24 Hr 341.3 (A)   Creatinine, Ur 31.05 (A)   Urine Volume 3,750   Creatinine, 24 HR Urine 1.2   Microalbumin/CR ratio Urine *      Vitamin D deficiency  Vitamin D(25-OH)Total   Date Value Ref Range Status   10/02/2022 28 30 - 100 ng/mL Final   Is on 1000 units but does not take consistently      Osteopenia  Bone density done on 04/2020 showed osteopenia with low FRAX score (although very close to osteoporosis). No falls or fractures since last visit.   She had [facility] March [facility]24 which now shows osteoporosis with T-score of -2.5 in left femoral neck. This cannot be compared to previous study as it was completed at different facility.   No fractures.   On 1000 international unit(s) Vitamin D3 supplementation inconsistently  Not currently on calcium supplementation due to hx of kidney stone and hypercalciuria    No falls, fractures reported     Review of Systems  Per HPI    Objective:          albuterol  sulfate (PROAIR  HFA) 90 mcg/actuation HFA aerosol inhaler Inhale two puffs by mouth into the lungs every 6 hours as needed for Wheezing or Shortness of Breath. Shake well before use.    alendronate  (FOSAMAX ) 70 mg tablet Take one tablet by mouth every 7 days. Take at least 30 minutes before breakfast with plain water . Do not lie down for 30 minutes.    aspirin  (ASPIRIN  CHILDRENS) 81 mg chewable tablet Chew one tablet by mouth daily. Take with food.    atorvastatin  (LIPITOR) 80 mg tablet TAKE 1 TABLET BY MOUTH DAILY    carvediloL  (COREG ) 12.5 mg tablet Take one tablet by mouth twice daily with meals. Take with food.    diclofenac sodium (VOLTAREN-XR) 100 mg xr tablet Take one tablet by mouth as Needed. Take with food.    escitalopram  oxalate (LEXAPRO ) 20 mg tablet Take one tablet by mouth daily.    ezetimibe  (ZETIA ) 10 mg tablet Take one tablet by mouth daily.    fluticasone propionate (FLONASE) 50 mcg/actuation nasal spray Apply  to each nostril as directed as Needed. Shake bottle gently before using.     furosemide  (LASIX ) 20 mg tablet TAKE 2 TABLETS BY MOUTH IN THE MORNING AS NEEDED FOR SWELLING OR WEIGHT GAIN (Patient taking differently: Take one tablet by mouth daily.)    glipiZIDE  CR (GLUCOTROL  XL) 2.5 mg tablet Take one tablet by mouth twice daily with meals.    HYDROcodone/acetaminophen  (NORCO) 10/325 mg tablet Take one tablet by mouth every 6 hours as needed for Pain.    insulin  degludec-liraglutide (XULTOPHY  100/3.6) 100 unit-3.6 mg /mL (3 mL) injection PEN Inject twenty four Units under the skin daily. You may increase to 26 U if fasting sugar is not between 100-120 mg/dL    levothyroxine  (SYNTHROID ) 50 mcg tablet Take one tablet by mouth daily 30 minutes before breakfast.    losartan  (COZAAR ) 50 mg tablet Take one tablet by mouth daily.    nitroglycerin  (NITROSTAT ) 0.4 mg tablet Place 1 tab under tongue every 5 minutes as needed for Chest Pain (Not to exceed 3 doses /15 min. If pain persists , seek medical attention    traMADoL 100 mg tablet TAKE 1/2 TO 1 (ONE-HALF TO ONE) TABLET BY MOUTH EVERY 6 HOURS AS NEEDED FOR PAIN. DO NOT EXCEED 4 DOSES PER DAY    triamcinolone acetonide (KENALOG) 0.1 % topical cream Apply  topically to affected area as Needed.    valACYclovir (VALTREX) 500 mg tablet Take one tablet by mouth every 24 hours.     Vitals:    07/31/23 0957   BP: 120/54   BP Source: Arm, Left Upper   Pulse: 65   Temp: 36.7 ?C (98 ?F)   PainSc: Zero   Weight: 85.7 kg (189 lb)   Height: 160  cm (5' 3)       Body mass index is 33.48 kg/m?Meghan Carlson     Physical Exam  Nursing note reviewed.   Constitutional:       Appearance: Normal appearance.   HENT:      Head: Normocephalic and atraumatic.      Nose: Nose normal.   Eyes:      General: No scleral icterus.  Neck:      Vascular: No carotid bruit.   Cardiovascular:      Rate and Rhythm: Normal rate and regular rhythm.      Pulses: Normal pulses.      Heart sounds: Normal heart sounds.   Pulmonary:      Effort: Pulmonary effort is normal. No respiratory distress.      Breath sounds: No rales.   Musculoskeletal:         General: No tenderness (none over spine).   Neurological:      General: No focal deficit present.      Mental Status: She is alert and oriented to person, place, and time. Mental status is at baseline.   Psychiatric:         Mood and Affect: Mood normal.         Behavior: Behavior normal.         Thought Content: Thought content normal.         Judgment: Judgment normal.               Lab Results   Component Value Date    URMALBCRRAT 10.8 07/18/2023    TSH 1.14 07/18/2023    VITD25 28 10/02/2022         Comprehensive Metabolic Profile    Lab Results   Component Value Date/Time    NA 140 07/29/2023 12:00 AM    K 4.5 07/29/2023 12:00 AM    CL 104 07/29/2023 12:00 AM    CO2 28.0 07/29/2023 12:00 AM    GAP 8 07/29/2023 12:00 AM    BUN 10.1 07/29/2023 12:00 AM    CR 0.89 07/29/2023 12:00 AM    GLU 201 (H) 07/29/2023 12:00 AM    Lab Results   Component Value Date/Time    CA 8.8 07/29/2023 12:00 AM    PO4 4.2 03/06/2020 03:40 AM    ALBUMIN 3.9 07/29/2023 12:00 AM    TOTPROT 7.0 07/29/2023 12:00 AM    ALKPHOS 79 07/29/2023 12:00 AM    AST 20 07/29/2023 12:00 AM    ALT 18 07/29/2023 12:00 AM    TOTBILI 1.71 (H) 07/29/2023 12:00 AM    GFR 68.8 07/29/2023 12:00 AM    GFRAA >60 03/06/2020 03:40 AM        CBC w diff    Lab Results   Component Value Date/Time    WBC 9.3 03/06/2020 03:40 AM    RBC 4.10 03/06/2020 03:40 AM    HGB 12.3 03/06/2020 03:40 AM    HCT 37.7 03/06/2020 03:40 AM    MCV 92.0 03/06/2020 03:40 AM    MCH 30.0 03/06/2020 03:40 AM    MCHC 32.6 03/06/2020 03:40 AM    RDW 14.5 03/06/2020 03:40 AM    PLTCT 273 10/02/2022 12:00 AM    MPV 9.2 03/06/2020 03:40 AM    No results found for: NEUT, ANC, LYMA, ALC, MONA, AMC, EOSA, AEC, BASA, ABC     Lab Results   Component Value Date    CHOL 116 07/29/2023    TRIG 122 07/29/2023  HDL 33 (L) 07/29/2023    LDL 59 07/29/2023    VLDL 24 07/29/2023    NONHDLCHOL 95 12/11/2017    CHOLHDLC 4 07/29/2023          Lab Results   Component Value Date    A1C 8.2 (A) 07/31/2023    A1C 8.7 (A) 04/17/2023    A1C 8.4 (A) 12/12/2022    HGBA1C 7.2 (H) 09/12/2018    HGBA1C 6.7 (H) 02/07/2017     BP Readings from Last 5 Encounters:   07/31/23 120/54   07/25/23 120/72   07/24/23 119/72   04/17/23 117/69   01/23/23 116/64     BMI Readings from Last 8 Encounters:   07/31/23 33.48 kg/m?   07/25/23 33.69 kg/m?   07/24/23 33.63 kg/m?   04/17/23 33.55 kg/m?   01/23/23 33.80 kg/m?   12/12/22 34.01 kg/m?   06/20/22 33.66 kg/m?   12/18/21 34.01 kg/m?     Wt Readings from Last 8 Encounters:   07/31/23 85.7 kg (189 lb)   07/25/23 86.3 kg (190 lb 3.2 oz)   07/24/23 86.1 kg (189 lb 12.8 oz)   04/17/23 85.9 kg (189 lb 6.4 oz)   01/23/23 86.5 kg (190 lb 12.8 oz)   12/12/22 87.1 kg (192 lb)   06/20/22 86.2 kg (190 lb)   12/18/21 87.1 kg (192 lb)        Assessment and Plan:  Diabetes mellitus type 2- un-controlled  glipizide 2.5 mg BID   Xultophy 26 units daily   A1c 8.2%. Goal 7-7.5 %  Ozempic was cost prohibitive. SGLTi discontinued due to UTI's.    Plan:   -continue glipizide 2.5 mg BID   -Reduce Xultophy to 24 units daily.  Reviewed TTTG.   - continue Libre 3 -- upgrade to 3+   -repeat urine microalbumin was WNL in April 2025     Diabetes mellitus annual monitoring  Urine microalbumin:  No microalbuminuria in April 2025  Lipid panel: April 2025--  LDL at target   Blood chemistry: last done on June 2024 with normal creatinine    Hypothyroidism  Lab Results   Component Value Date    TSH 1.14 07/18/2023    FREET4R 1.01 07/18/2023     Currently on levothyroxine 50 mcg daily.  Check TSH and free T4 annually     Osteoporosis  Bone density done on 04/2020 showed osteopenia with low FRAX score (although very close to osteoporosis).  Repeat bone density was completed in March 2024 at [facility]. It cannot be compared to previous scans due to different tables/locations.   Pt in osteoporosis range with t-score of -2.5 in left femoral neck   Started fosamax in January 2025. Tolerating.   She has no hx of reflux/heart burn  Take 1000 international unit(s) D3 daily or 5000 international unit(s) D3 three times a week  Take 2 servings of dairy daily, increase water intake to make 2 L of urine daily  [facility] 2026 [facility]    Vitamin D deficiency  Lab Results   Component Value Date    VITD25 28 10/02/2022   Plan as above      Hypercalciuria  Lab Results   Component Value Date    CA 8.8 07/29/2023    ALBUMIN 3.9 07/29/2023    PTH 31 12/24/2019   Check random urine calcium and creatinine  Normal serum calcium on recent BMP    Chronic combined systolic HF   Managed by cardiology  Did not tolerate [id number]    Patient verbalized understanding of the plan and is in agreement.    There are no Patient Instructions on file for this visit.     Meghan Carlson was seen today for diabetes.    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

## 2023-08-01 DIAGNOSIS — E1165 Type 2 diabetes mellitus with hyperglycemia: Secondary | ICD-10-CM

## 2023-08-14 ENCOUNTER — Encounter: Admit: 2023-08-14 | Discharge: 2023-08-14 | Payer: MEDICARE

## 2023-12-04 ENCOUNTER — Encounter: Admit: 2023-12-04 | Discharge: 2023-12-04 | Payer: MEDICARE

## 2024-01-13 ENCOUNTER — Encounter: Admit: 2024-01-13 | Discharge: 2024-01-13 | Payer: MEDICARE

## 2024-01-13 ENCOUNTER — Ambulatory Visit: Admit: 2024-01-13 | Discharge: 2024-01-14 | Payer: MEDICARE

## 2024-01-17 ENCOUNTER — Encounter: Admit: 2024-01-17 | Discharge: 2024-01-17 | Payer: MEDICARE

## 2024-01-20 ENCOUNTER — Encounter: Admit: 2024-01-20 | Discharge: 2024-01-20 | Payer: MEDICARE

## 2024-01-20 NOTE — Telephone Encounter
 Received voicemail from patient  Patient wanting to move forward with Dexcom  Script sent to [facility] with chart notes

## 2024-03-25 ENCOUNTER — Encounter: Admit: 2024-03-25 | Discharge: 2024-03-25 | Payer: MEDICARE
